# Patient Record
Sex: Female | Born: 1945 | Race: White | Hispanic: No | Marital: Married | Smoking: Former smoker
Health system: Southern US, Community
[De-identification: ages and names within clinical notes are randomized; demographics above are authoritative.]

## PROBLEM LIST (undated history)

## (undated) DIAGNOSIS — I10 Essential (primary) hypertension: Secondary | ICD-10-CM

## (undated) DIAGNOSIS — E7800 Pure hypercholesterolemia, unspecified: Secondary | ICD-10-CM

## (undated) DIAGNOSIS — M1990 Unspecified osteoarthritis, unspecified site: Secondary | ICD-10-CM

## (undated) DIAGNOSIS — G4730 Sleep apnea, unspecified: Secondary | ICD-10-CM

## (undated) DIAGNOSIS — E538 Deficiency of other specified B group vitamins: Secondary | ICD-10-CM

## (undated) DIAGNOSIS — Z8489 Family history of other specified conditions: Secondary | ICD-10-CM

## (undated) DIAGNOSIS — D649 Anemia, unspecified: Secondary | ICD-10-CM

## (undated) DIAGNOSIS — K589 Irritable bowel syndrome without diarrhea: Secondary | ICD-10-CM

## (undated) DIAGNOSIS — N1831 Chronic kidney disease, stage 3a: Secondary | ICD-10-CM

## (undated) DIAGNOSIS — K649 Unspecified hemorrhoids: Secondary | ICD-10-CM

## (undated) DIAGNOSIS — K5790 Diverticulosis of intestine, part unspecified, without perforation or abscess without bleeding: Secondary | ICD-10-CM

## (undated) DIAGNOSIS — E039 Hypothyroidism, unspecified: Secondary | ICD-10-CM

## (undated) DIAGNOSIS — Z87898 Personal history of other specified conditions: Secondary | ICD-10-CM

## (undated) DIAGNOSIS — M8580 Other specified disorders of bone density and structure, unspecified site: Secondary | ICD-10-CM

## (undated) DIAGNOSIS — I4510 Unspecified right bundle-branch block: Secondary | ICD-10-CM

## (undated) DIAGNOSIS — K219 Gastro-esophageal reflux disease without esophagitis: Secondary | ICD-10-CM

## (undated) DIAGNOSIS — E119 Type 2 diabetes mellitus without complications: Secondary | ICD-10-CM

## (undated) DIAGNOSIS — Z9289 Personal history of other medical treatment: Secondary | ICD-10-CM

## (undated) DIAGNOSIS — R42 Dizziness and giddiness: Secondary | ICD-10-CM

## (undated) DIAGNOSIS — G43909 Migraine, unspecified, not intractable, without status migrainosus: Secondary | ICD-10-CM

## (undated) DIAGNOSIS — T4145XA Adverse effect of unspecified anesthetic, initial encounter: Secondary | ICD-10-CM

## (undated) DIAGNOSIS — T8859XA Other complications of anesthesia, initial encounter: Secondary | ICD-10-CM

## (undated) DIAGNOSIS — Z9889 Other specified postprocedural states: Secondary | ICD-10-CM

## (undated) DIAGNOSIS — R112 Nausea with vomiting, unspecified: Secondary | ICD-10-CM

## (undated) DIAGNOSIS — E785 Hyperlipidemia, unspecified: Secondary | ICD-10-CM

## (undated) HISTORY — DX: Pure hypercholesterolemia, unspecified: E78.00

## (undated) HISTORY — DX: Migraine, unspecified, not intractable, without status migrainosus: G43.909

## (undated) HISTORY — PX: LAPAROSCOPY: SHX197

## (undated) HISTORY — PX: JOINT REPLACEMENT: SHX530

## (undated) HISTORY — DX: Personal history of other specified conditions: Z87.898

## (undated) HISTORY — DX: Gastro-esophageal reflux disease without esophagitis: K21.9

## (undated) HISTORY — DX: Personal history of other medical treatment: Z92.89

---

## 1975-04-07 DIAGNOSIS — J189 Pneumonia, unspecified organism: Secondary | ICD-10-CM

## 1975-04-07 HISTORY — PX: TUBAL LIGATION: SHX77

## 1975-04-07 HISTORY — DX: Pneumonia, unspecified organism: J18.9

## 2000-06-04 HISTORY — PX: COLONOSCOPY: SHX174

## 2002-04-06 HISTORY — PX: CHOLECYSTECTOMY: SHX55

## 2004-02-13 ENCOUNTER — Ambulatory Visit: Payer: Self-pay | Admitting: Family Medicine

## 2004-03-06 ENCOUNTER — Ambulatory Visit: Payer: Self-pay | Admitting: Otolaryngology

## 2004-08-27 ENCOUNTER — Ambulatory Visit: Payer: Self-pay | Admitting: Unknown Physician Specialty

## 2004-09-04 ENCOUNTER — Ambulatory Visit: Payer: Self-pay | Admitting: Unknown Physician Specialty

## 2005-03-26 ENCOUNTER — Emergency Department: Payer: Self-pay | Admitting: Emergency Medicine

## 2005-03-26 ENCOUNTER — Other Ambulatory Visit: Payer: Self-pay

## 2005-06-08 ENCOUNTER — Ambulatory Visit: Payer: Self-pay | Admitting: Unknown Physician Specialty

## 2005-07-05 HISTORY — PX: COLONOSCOPY WITH ESOPHAGOGASTRODUODENOSCOPY (EGD): SHX5779

## 2005-07-20 ENCOUNTER — Ambulatory Visit: Payer: Self-pay | Admitting: Gastroenterology

## 2005-07-21 ENCOUNTER — Ambulatory Visit: Payer: Self-pay | Admitting: Gastroenterology

## 2006-02-22 ENCOUNTER — Ambulatory Visit: Payer: Self-pay | Admitting: Urology

## 2006-06-30 ENCOUNTER — Ambulatory Visit: Payer: Self-pay

## 2007-07-20 ENCOUNTER — Ambulatory Visit: Payer: Self-pay

## 2008-06-27 ENCOUNTER — Ambulatory Visit: Payer: Self-pay | Admitting: Unknown Physician Specialty

## 2008-07-23 ENCOUNTER — Ambulatory Visit: Payer: Self-pay

## 2009-07-31 ENCOUNTER — Ambulatory Visit: Payer: Self-pay

## 2010-04-06 DIAGNOSIS — Z9289 Personal history of other medical treatment: Secondary | ICD-10-CM

## 2010-04-06 HISTORY — DX: Personal history of other medical treatment: Z92.89

## 2010-07-22 ENCOUNTER — Ambulatory Visit: Payer: Self-pay | Admitting: Unknown Physician Specialty

## 2010-08-04 ENCOUNTER — Ambulatory Visit: Payer: Self-pay

## 2011-07-23 IMAGING — MG MAM DGTL SCREENING MAMMO W/CAD
1 series · 5 of 5 positions shown · non-contrast
Comparison: none

REASON FOR EXAM: screening
COMMENTS:  Submitted by practice: [facility] Scheduled by user: Tadanori
Moolman

[R CC · right · 5 of 5 slices shown]
[im 1/5]
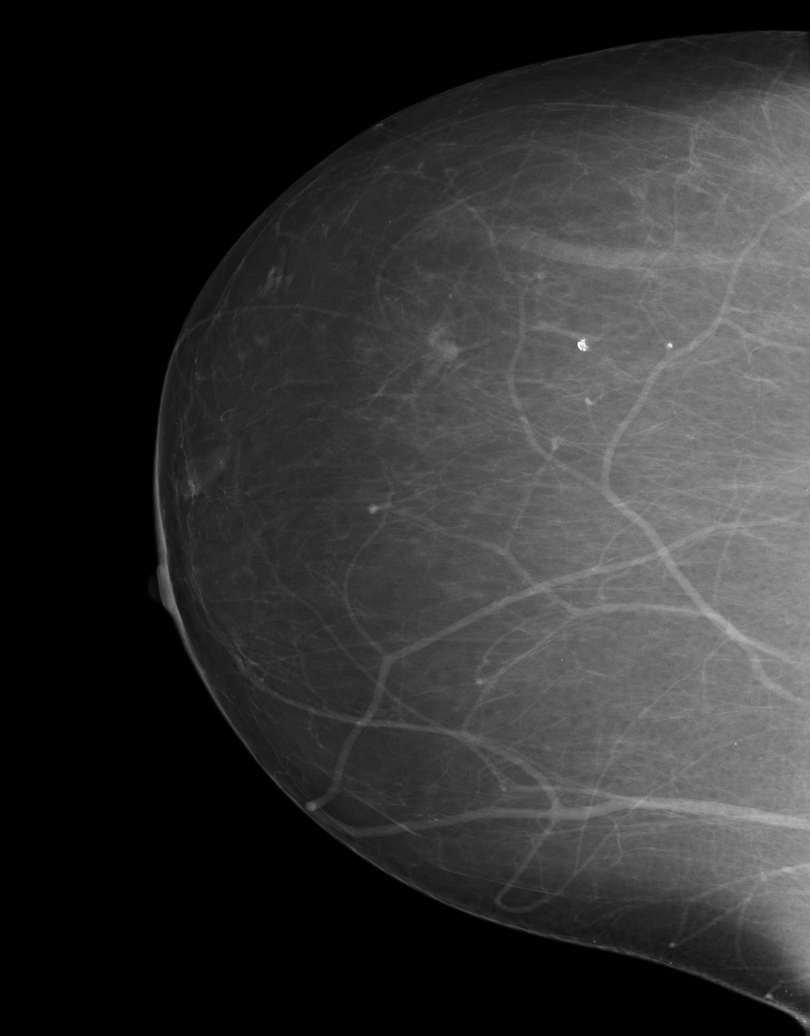
[im 2/5]
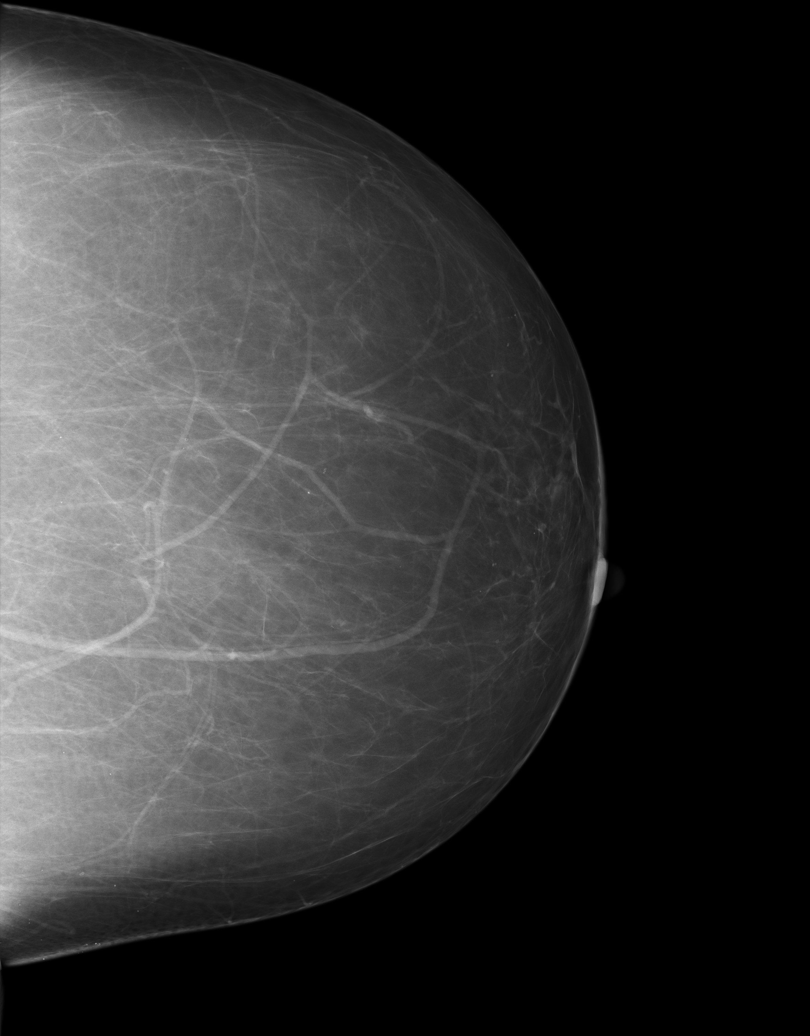
[im 3/5]
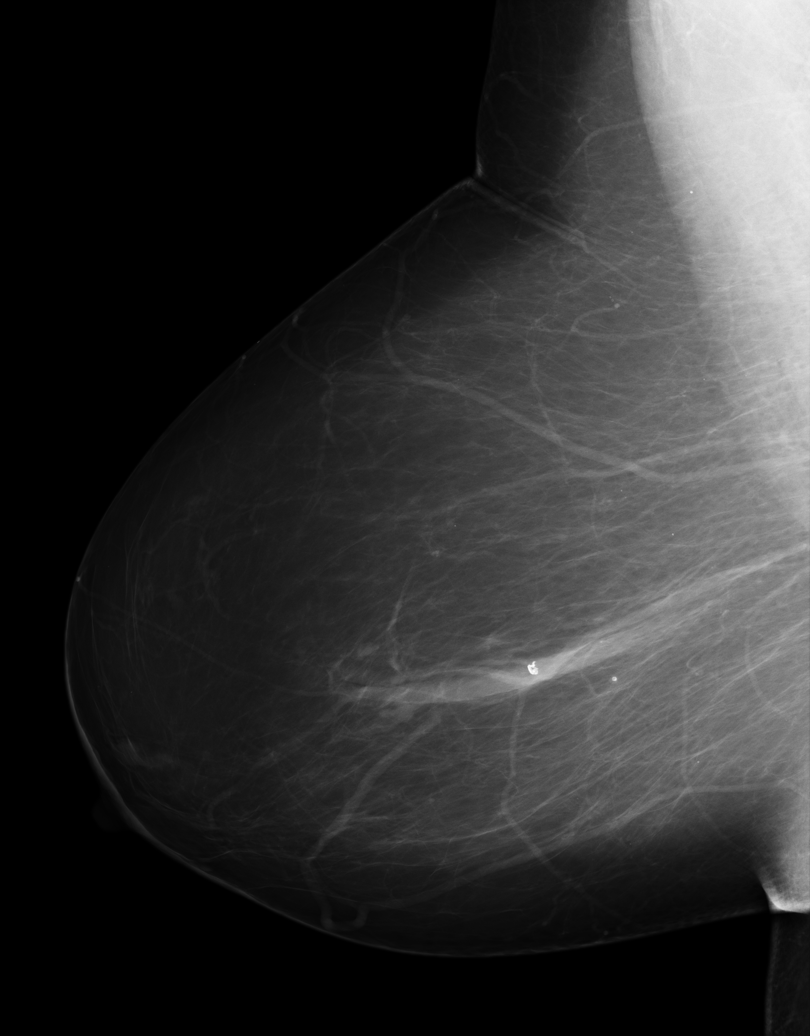
[im 4/5]
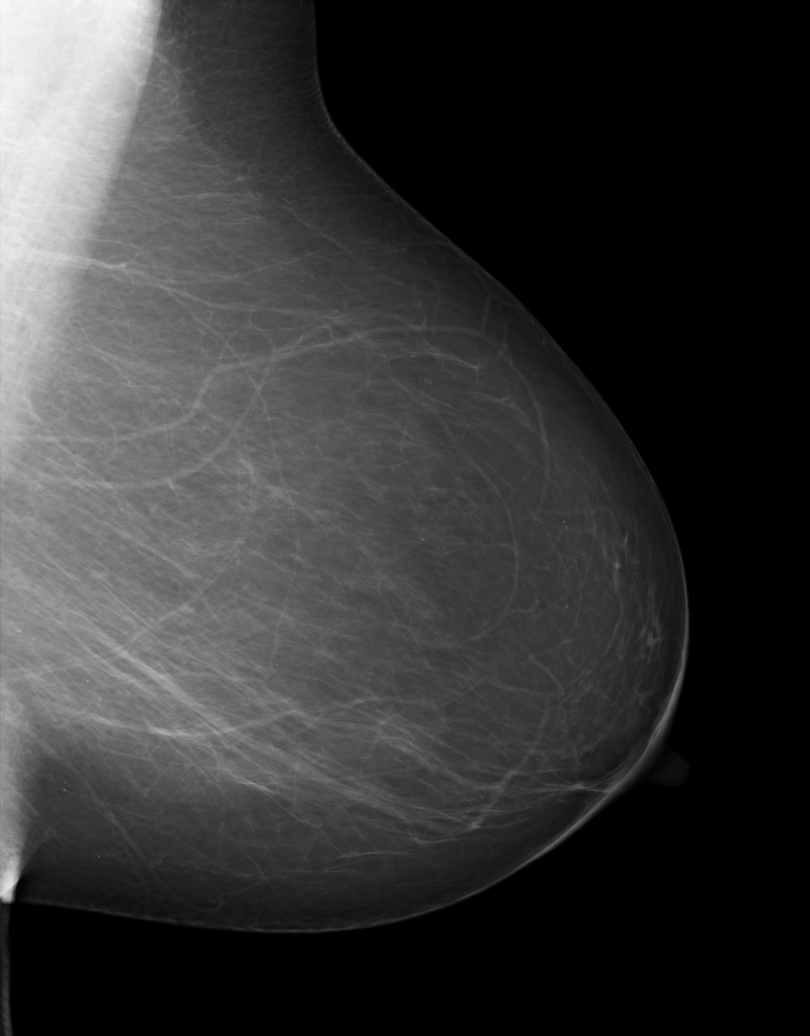
[im 5/5]
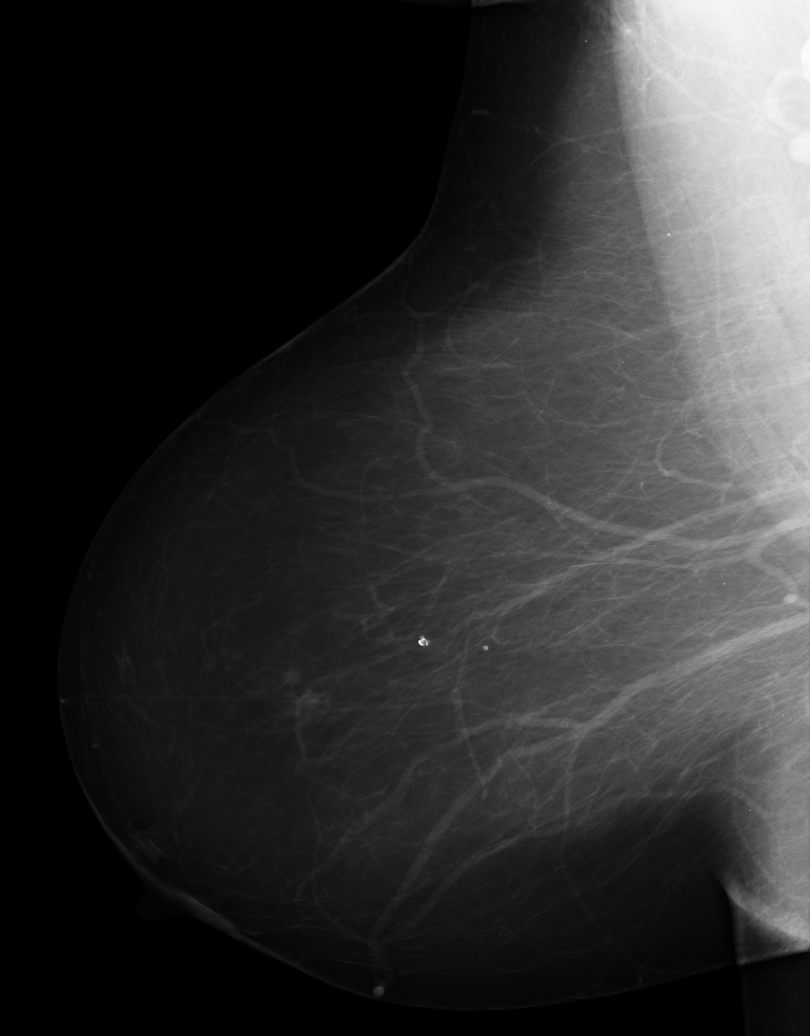

[5 of 5 positions shown; findings below may reference images not displayed]

PROCEDURE:     MAM - MAM DGTL SCREENING MAMMO W/CAD  - August 04, 2010  [DATE]

RESULT:       Comparison is made to prior studies dated 07/31/2009 and
07/23/2008.

The breasts are primarily fatty involuted.  There is no mammographic
evidence to suggest malignancy. Stable benign-appearing calcifications are
identified within the right breast.
IMPRESSION: BI-RADS:  Category 2- Benign Finding.

A negative mammogram report does not preclude biopsy or other evaluation of
a clinically palpable or otherwise suspicious mass or lesion. Breast cancer
may not be detected by mammography in up to 10% of cases.

## 2011-09-17 ENCOUNTER — Ambulatory Visit: Payer: Self-pay

## 2011-11-24 ENCOUNTER — Emergency Department: Payer: Self-pay | Admitting: *Deleted

## 2011-11-24 LAB — CBC
HCT: 37.5 % (ref 35.0–47.0)
HGB: 12.6 g/dL (ref 12.0–16.0)
MCV: 97 fL (ref 80–100)
RBC: 3.86 10*6/uL (ref 3.80–5.20)

## 2011-11-24 LAB — TROPONIN I: Troponin-I: <0.02 ng/mL

## 2011-11-24 LAB — BASIC METABOLIC PANEL
Anion Gap: 9 (ref 7–16)
BUN: 16 mg/dL (ref 7–18)
Chloride: 93 mmol/L — ABNORMAL LOW (ref 98–107)
Co2: 28 mmol/L (ref 21–32)
Creatinine: 1.17 mg/dL (ref 0.60–1.30)
Potassium: 4.2 mmol/L (ref 3.5–5.1)
Sodium: 130 mmol/L — ABNORMAL LOW (ref 136–145)

## 2011-11-24 LAB — CK TOTAL AND CKMB (NOT AT ARMC)
CK, Total: 114 U/L (ref 21–215)
CK, Total: 126 U/L (ref 21–215)
CK-MB: 0.9 ng/mL (ref 0.5–3.6)

## 2012-09-28 ENCOUNTER — Ambulatory Visit: Payer: Self-pay

## 2012-09-28 DIAGNOSIS — Z87898 Personal history of other specified conditions: Secondary | ICD-10-CM

## 2012-09-28 HISTORY — DX: Personal history of other specified conditions: Z87.898

## 2012-09-28 LAB — HM MAMMOGRAPHY

## 2013-09-01 DIAGNOSIS — K529 Noninfective gastroenteritis and colitis, unspecified: Secondary | ICD-10-CM | POA: Insufficient documentation

## 2013-09-04 DIAGNOSIS — G4730 Sleep apnea, unspecified: Secondary | ICD-10-CM | POA: Insufficient documentation

## 2013-09-04 HISTORY — PX: COLONOSCOPY WITH ESOPHAGOGASTRODUODENOSCOPY (EGD): SHX5779

## 2013-09-04 HISTORY — PX: COLONOSCOPY: SHX174

## 2013-09-19 ENCOUNTER — Ambulatory Visit: Payer: Self-pay | Admitting: Gastroenterology

## 2013-09-21 LAB — PATHOLOGY REPORT

## 2013-09-25 DIAGNOSIS — I10 Essential (primary) hypertension: Secondary | ICD-10-CM | POA: Insufficient documentation

## 2013-09-25 DIAGNOSIS — M1990 Unspecified osteoarthritis, unspecified site: Secondary | ICD-10-CM | POA: Insufficient documentation

## 2013-10-02 LAB — HM PAP SMEAR

## 2013-11-13 ENCOUNTER — Ambulatory Visit: Payer: Self-pay | Admitting: Family Medicine

## 2013-11-16 DIAGNOSIS — K589 Irritable bowel syndrome without diarrhea: Secondary | ICD-10-CM | POA: Insufficient documentation

## 2014-04-20 DIAGNOSIS — E119 Type 2 diabetes mellitus without complications: Secondary | ICD-10-CM | POA: Insufficient documentation

## 2014-04-20 DIAGNOSIS — E039 Hypothyroidism, unspecified: Secondary | ICD-10-CM | POA: Insufficient documentation

## 2014-04-25 ENCOUNTER — Ambulatory Visit: Payer: Self-pay | Admitting: General Practice

## 2014-04-25 DIAGNOSIS — I10 Essential (primary) hypertension: Secondary | ICD-10-CM

## 2014-04-25 LAB — SEDIMENTATION RATE: ERYTHROCYTE SED RATE: 18 mm/h (ref 0–30)

## 2014-04-25 LAB — CBC
HCT: 41.9 % (ref 35.0–47.0)
HGB: 13.7 g/dL (ref 12.0–16.0)
MCH: 33.3 pg (ref 26.0–34.0)
MCHC: 32.8 g/dL (ref 32.0–36.0)
MCV: 102 fL — ABNORMAL HIGH (ref 80–100)
Platelet: 301 10*3/uL (ref 150–440)
RBC: 4.13 10*6/uL (ref 3.80–5.20)
RDW: 13.4 % (ref 11.5–14.5)
WBC: 7.5 10*3/uL (ref 3.6–11.0)

## 2014-04-25 LAB — MRSA PCR SCREENING

## 2014-04-25 LAB — URINALYSIS, COMPLETE
Bilirubin,UR: NEGATIVE
Blood: NEGATIVE
GLUCOSE, UR: NEGATIVE mg/dL (ref 0–75)
Hyaline Cast: 2
Ketone: NEGATIVE
NITRITE: NEGATIVE
Ph: 6 (ref 4.5–8.0)
Protein: 30
RBC,UR: 1 /HPF (ref 0–5)
Specific Gravity: 1.019 (ref 1.003–1.030)
Squamous Epithelial: 3

## 2014-04-25 LAB — BASIC METABOLIC PANEL
Anion Gap: 3 — ABNORMAL LOW (ref 7–16)
BUN: 15 mg/dL (ref 7–18)
CALCIUM: 10.1 mg/dL (ref 8.5–10.1)
CREATININE: 0.94 mg/dL (ref 0.60–1.30)
Chloride: 102 mmol/L (ref 98–107)
Co2: 34 mmol/L — ABNORMAL HIGH (ref 21–32)
EGFR (African American): >60
Glucose: 117 mg/dL — ABNORMAL HIGH (ref 65–99)
Osmolality: 279 (ref 275–301)
Potassium: 3.7 mmol/L (ref 3.5–5.1)
Sodium: 139 mmol/L (ref 136–145)

## 2014-04-25 LAB — PROTIME-INR
INR: 0.9
Prothrombin Time: 12 secs (ref 11.5–14.7)

## 2014-04-25 LAB — APTT: Activated PTT: 26 secs (ref 23.6–35.9)

## 2014-04-25 LAB — HEMOGLOBIN A1C: Hemoglobin A1C: 6.7 % — ABNORMAL HIGH (ref 4.2–6.3)

## 2014-04-27 LAB — URINE CULTURE

## 2014-05-02 DIAGNOSIS — I4510 Unspecified right bundle-branch block: Secondary | ICD-10-CM | POA: Insufficient documentation

## 2014-05-14 ENCOUNTER — Inpatient Hospital Stay: Payer: Self-pay | Admitting: General Practice

## 2014-05-14 HISTORY — PX: TOTAL KNEE ARTHROPLASTY: SHX125

## 2014-05-15 ENCOUNTER — Encounter: Payer: Self-pay | Admitting: Internal Medicine

## 2014-06-05 ENCOUNTER — Encounter
Admit: 2014-06-05 | Disposition: A | Discharge status: Home / Self Care | Payer: Self-pay | Attending: Internal Medicine | Admitting: Internal Medicine

## 2014-07-06 ENCOUNTER — Encounter
Admit: 2014-07-06 | Disposition: A | Discharge status: Home / Self Care | Payer: Self-pay | Attending: Internal Medicine | Admitting: Internal Medicine

## 2014-08-05 NOTE — Discharge Summary (Signed)
PATIENT NAME:  Cindy Wolfe, Cindy Wolfe MR#:  [id number] DATE OF BIRTH:  03-19-1946  DATE OF ADMISSION:  05/14/2014 DATE OF DISCHARGE:  05/17/2014  ADMITTING DIAGNOSIS: Degenerative arthrosis of the right knee.   DISCHARGE DIAGNOSIS: Degenerative arthrosis of the right knee.   OPERATION: On 05/14/2014 the patient had a right total knee arthroplasty using computer-aided navigation.   SURGEON: Francesco SorJames Hooten, M.D.   ASSISTANT: Van ClinesJon Wolfe, PA.   ANESTHESIA: Spinal.   ESTIMATED BLOOD LOSS: 50 mL.  IMPLANTS USED: DePuy PFC Sigma size 4N narrow posterior stabilized femoral component that was cemented, size 4 MBT tibial component that was cemented, 35 mm three-peg oval dome patella that was cemented and a 10 mm stabilized rotating platform polyethylene insert. Gentamicin bone cement was used. The patient was stabilized, brought to the recovery room, and then brought down to the orthopedic floor.   HISTORY: The patient is a 69 year old female who presented for upcoming total knee replacement on the right. The patient has been refractory to conservative treatment. The patient has continued having pain with activities of daily living. The patient has tried cortisone injections and Synvisc injections.   PHYSICAL EXAMINATION: GENERAL: The patient has an antalgic gait with varus thrust of both knees with ambulation.  LUNGS: Clear to auscultation.  CARDIOVASCULAR: Regular rate and rhythm with no murmur.  MUSCULOSKELETAL: In regard to the right knee, the patient has moderate swelling. The patient has range of motion with full extension to 104 degrees of flexion. The patient has medial joint line pain.  NEUROLOGIC: Intact.  HOSPITAL COURSE: After initial admission on 05/14/2014, the patient had a hemoglobin of 12.9 and on postop day two it was 11.9. The patient did have a potassium level that was down at 3.2, which adding potassium increased it up to 3.5 on the day of discharge.   CONDITION AT DISCHARGE:  Stable.   DISPOSITION: The patient was sent to rehab. The patient did work with physical therapy and did well.   DISCHARGE INSTRUCTIONS: The patient [facility] May 29, 2014. The patient will do weight bear as tolerated. The patient will elevated the effected leg with 1 to 2 pillows. The patient will use thigh-high TED hose on both legs, to be removed 1 hour every 8 hour shift. The patient will do elevation of heels off the bed and incentive spirometer. The patient has a diabetic diet. The patient is to use the Polar Care to decrease swelling. The patient will keep her dressing on and do a dressing change as needed. The patient will call the clinic if there is any bright red bleeding or calf pain or bowel or bladder difficulty or fever greater than 101.5. The patient will do physical therapy per protocol and occupational therapy per protocol.   DISCHARGE MEDICATIONS: Metformin 500 mg 1 tablet b.i.d., Metamucil 1 dose daily, Imodium 30 mL p.r.n., Tylenol 500 mg 2 tablets q. 6 hours as needed for fever greater than 101.5 or any pain. acidophilus 1 capsule daily, Tums extra-strength 1 tablet daily, simply saline 1 dose nasally as needed, Gly-Oxide 10% topical liquid 4 times a day as needed, Bonine 25 mg 1 tablet t.i.d., vitamin D3 1000 international units 1 tablet daily, vitamin B12 500 mcg 1 tablet daily, multivitamin 1 tablet daily, Synthroid 125 mcg 1 tablet daily, hydrochlorothiazide/losartan 25 mg/100 mg 1 tablet at bedtime, amlodipine 5 mg 1 tablet daily, propranolol 40 mg 1 tablet b.i.d., oxycodone 5 mg 1 tablet every 4 hours as needed  for moderate pain, tramadol 50 mg 1 tablet q. 4 hours as needed for moderate pain, milk of magnesia 8% 30 mL b.i.d., Lovenox 40 mg subcutaneous every 12 hours x14 days and discontinue, betamethasone/clotrimazole 0.05% topical q. 24 hours, bisacodyl 10 mg rectally p.r.n. for constipation, Senokot-S 1 tablet b.i.d., pantoprazole 40  mg 1 tablet b.i.d.   The patient will not begin an aspirin 81 mg until her Lovenox has been completed.   ____________________________ Shela Commons. Dedra Skeens, [place] jtm:sb D: 05/17/2014 06:59:32 ET T: 05/17/2014 07:08:09 ET JOB#: [id number]  cc: J. Dedra Skeens, [place], <Dictator> J Jaquitta Dupriest [facility] PA ELECTRONICALLY SIGNED 05/24/2014 15:50

## 2014-08-05 NOTE — Op Note (Signed)
PATIENT NAME:  Cindy Wolfe, Cindy Wolfe MR#:  [id number] DATE OF BIRTH:  Sep 17, 1945  DATE OF PROCEDURE:  05/14/2014  PREOPERATIVE DIAGNOSIS: Degenerative arthrosis of the right knee (primary).  POSTOPERATIVE DIAGNOSIS: Degenerative arthrosis of the right knee (primary).   PROCEDURE PERFORMED: Right total knee arthroplasty using computer-assisted navigation.   SURGEON: Illene LabradorJames P. Angie FavaHooten Jr., MD   ASSISTANT: Van ClinesJon Wolfe, PA (required to maintain retraction throughout the procedure).   ANESTHESIA: Spinal.   ESTIMATED BLOOD LOSS: 50 mL.   FLUIDS REPLACED: 1400 mL of crystalloid.  TOURNIQUET TIME: 87 minutes.   DRAINS: Two medium drains to reinfusion system.  SOFT TISSUE RELEASES: Anterior cruciate ligament, posterior cruciate ligament, deep and superficial medial collateral ligament, and patellofemoral ligament.   IMPLANTS UTILIZED: DePuy PFC Sigma size 4N (narrow) posterior stabilized femoral component (cemented), size 4 MBT tibial component (cemented), 35 mm 3-peg oval dome patella (cemented), and a 10 mm stabilized rotating platform polyethylene insert. Gentamicin bone cement was utilized during the procedure due to the patient's history of diabetes.  INDICATIONS FOR SURGERY: The patient is a 69 year old female who has been seen for complaints of progressive bilateral knee pain with the right knee more symptomatic than the left. X-rays demonstrated severe degenerative changes in tricompartmental fascia with relative varus deformity. After discussion of the risks and benefits of surgical intervention, the patient expressed understanding of the risks and benefits and agreed with plans for surgical intervention.   PROCEDURE IN DETAIL: The patient was brought into the operating room, and, after adequate spinal anesthesia was achieved, a tourniquet was placed on the patient's upper right thigh. The patient's right knee and leg were cleaned and prepped with alcohol and DuraPrep and draped in the usual  sterile fashion. A "timeout" was performed as per usual protocol. The right lower extremity was exsanguinated using an Esmarch, and the tourniquet was inflated to 300 mmHg. An anterior longitudinal incision was made followed by a standard mid vastus approach. A large effusion was evacuated. The deep fibers of the medial collateral ligament were elevated in a subperiosteal fashion off the medial flare of the tibia so as to maintain a continuous soft tissue sleeve. The patella was subluxed laterally and the patellofemoral ligament was incised. Inspection of the knee demonstrated severe degenerative changes with full-thickness loss of articular cartilage. Prominent osteophytes were debrided using a rongeur. Two 4.0 mm Schanz pins were inserted into the femur and into the tibia for attachment of the array of trackers used for computer-assisted navigation. Hip center was identified using circumduction technique. Distal landmarks were mapped using the computer. The distal femur and proximal tibia were mapped using the computer. Distal femoral cutting guide was positioned using computer-assisted navigation so as to achieve a 5-degree distal valgus cut. Cut was performed and verified using the computer. Distal femur was sized and it was felt that a size 4N (narrow) femoral component was appropriate. A size 4 cutting guide was positioned and the anterior cut was performed and verified using the computer. This was followed by completion of the posterior and chamfer cuts. Femoral cutting guide for the central box was then positioned and the central box cut was performed. Attention was then directed to the proximal tibia. Medial and lateral menisci were excised. The extramedullary tibial cutting guide was positioned using computer-assisted navigation so as to achieve a 0-degree varus-valgus alignment and a 0-degree posterior slope. Cut was performed and verified using the computer. The proximal tibia was sized and it was felt  that a size 4 tibial  tray was appropriate. Tibial and femoral trials were inserted. Posterior osteophytes were debrided off of the femoral condyles. A 10 mm stabilized rotating platform polyethylene insert was inserted. The knee was felt to be tight medially. A Cobb elevator was used to elevate the superficial fibers of the medial collateral ligament in a subperiosteal fashion. This allowed for excellent mediolateral soft tissue balancing both in full extension and in flexion. Finally, the patella was cut and prepared so as to accommodate a 35 mm 3-peg oval dome patella. Patellar trial was placed and the knee was placed through a range of motion with excellent patellar tracking appreciated. The femoral trial was removed. The central post hole for the tibial component was reamed followed by insertion of a keel punch. Tibial trial was then removed. The cut surfaces of bone were irrigated with copious amounts of normal saline with antibiotic solution using pulsatile lavage and then suctioned dry. Polymethyl methacrylate cement with gentamicin was prepared in the usual fashion using a vacuum mixer. Cement was applied to the cut surface of the proximal tibia as well as along the undersurface of a size 4 MBT tibial component. Tibial component was positioned and impacted into place. Excess cement was removed using freer elevators. Cement was then applied to the cut surface of the femur as well as on the posterior flanges of a size 4N (narrow) posterior stabilized femoral component. Femoral component was positioned and impacted into place. Excess cement was removed using freer elevators. A 10 mm polyethylene trial was inserted and the knee was brought into full extension with steady axial compression applied. Finally, cement was applied to the backside of a 35 mm 3-peg oval dome patella and the patellar component was positioned and patellar clamp applied. Excess cement was removed using freer elevators. After adequate  curing of cement, the tourniquet was deflated after a total tourniquet time of 87 minutes. Hemostasis was achieved using electrocautery. The knee was irrigated with copious amounts of normal saline with antibiotic solution using pulsatile lavage and then suctioned dry. The knee was inspected for any residual cement debris. Then, 20 mL of 1.3% Exparel in 40 mL of normal saline was then injected along the posterior capsule, medial and lateral gutters, and along the arthrotomy site. A 10 mm stabilized rotating platform polyethylene insert was inserted and the knee was placed through a range of motion with excellent patellar tracking appreciated and excellent medial and lateral soft tissue balancing noted. Two medium drains were placed in the wound bed and brought out through a separate stab incision to be attached to a reinfusion system. The medial parapatellar portion of the incision was reapproximated using interrupted sutures of #1 Vicryl. The subcutaneous tissue was reapproximated in layers using first #0 Vicryl followed by #2-0 Vicryl. Then, 30 mL of 0.25% Marcaine with epinephrine was injected along the incision site in the subcutaneous tissue. Skin was closed with skin staples. Sterile dressing was applied.  The patient tolerated the procedure well. She was transported to the recovery room in stable condition.   ____________________________ Illene Labrador. Angie Fava., MD jph:ST D: 05/14/2014 20:09:27 ET T: 05/15/2014 01:54:08 ET JOB#: [id number]  cc: Illene Labrador. Angie Fava., MD, <Dictator> JAMES P Angie Fava MD ELECTRONICALLY SIGNED 05/21/2014 20:19

## 2014-11-23 NOTE — Patient Outreach (Signed)
[facility]) Care Management  11/23/2014  Arella Blinder [facility] 01/08/1946 [id number]   Referral from HTA tier 4 list, assigned to Colleen Can, [facility] for patient outreach.  Lidia Clavijo L. Anniebell Bedore, AAS [facility] Care Management Assistant

## 2014-12-17 ENCOUNTER — Other Ambulatory Visit: Payer: Self-pay

## 2014-12-17 NOTE — Patient Outreach (Signed)
[facility]) Care Management  12/17/2014  Lidia Clavijo [facility] 1945-05-22 [id number]   SUBJECTIVE: Received return call from patient. RNCM discussed and offered [facility] care management services to patient.  Patient refused services at this time.  Patient verbally agreed to receive [facility] outreach letter and pamphlet for future reference.   PLAN: RNCM will refer patient to Damita Rhodie to close due to refusal of services.  RNCM will send patient outreach letter and pamphlet as discussed.  RNCM will notify patients primary MD of refusal of services.   George Ina RN,BSN,CCM [facility] Telephonic Care Coordinator [phone]

## 2014-12-17 NOTE — Patient Outreach (Signed)
[facility]) Care Management  12/17/2014  Cindy Wolfe [facility] 20-Sep-1945 [id number]   Notification received from George Ina, RNCM to close patient case due to refusal of services.  Cindy Wolfe L. Katelynne Revak, AAS [facility] Care Management Assistant

## 2014-12-17 NOTE — Patient Outreach (Signed)
[facility]) Care Management  12/17/2014  Adan Beal [facility] Sep 06, 1945 [id number]   Referral reassigned to George Ina, [facility] for patient outreach.  Xhaiden Coombs L. Redmond Whittley, AAS [facility] Care Management Assistant

## 2014-12-17 NOTE — Patient Outreach (Signed)
[facility]) Care Management  12/17/2014  Cindy Wolfe [facility] 12-14-1945 [id number]  SUBJECTIVE: Telephone call to patient regarding health team advantage referral.  Unable to reach.  HIPAA compliant voice message left with call back phone number.   PLAN: RNCM will attempt 2nd telephone outreach to patient within 3 business days.   George Ina RN,BSN,CCM [facility] Telephonic Care Coordinator [phone]

## 2015-05-02 IMAGING — CR DG KNEE 1-2V*R*
1 series · 2 of 2 positions shown · non-contrast
Comparison: None.

CLINICAL DATA: Status post right total knee joint replacement.

EXAM:
RIGHT KNEE - 1-2 VIEW

[Series 1: ap · 0.17mm/px · 2 of 2 slices shown]
[im 1/2]
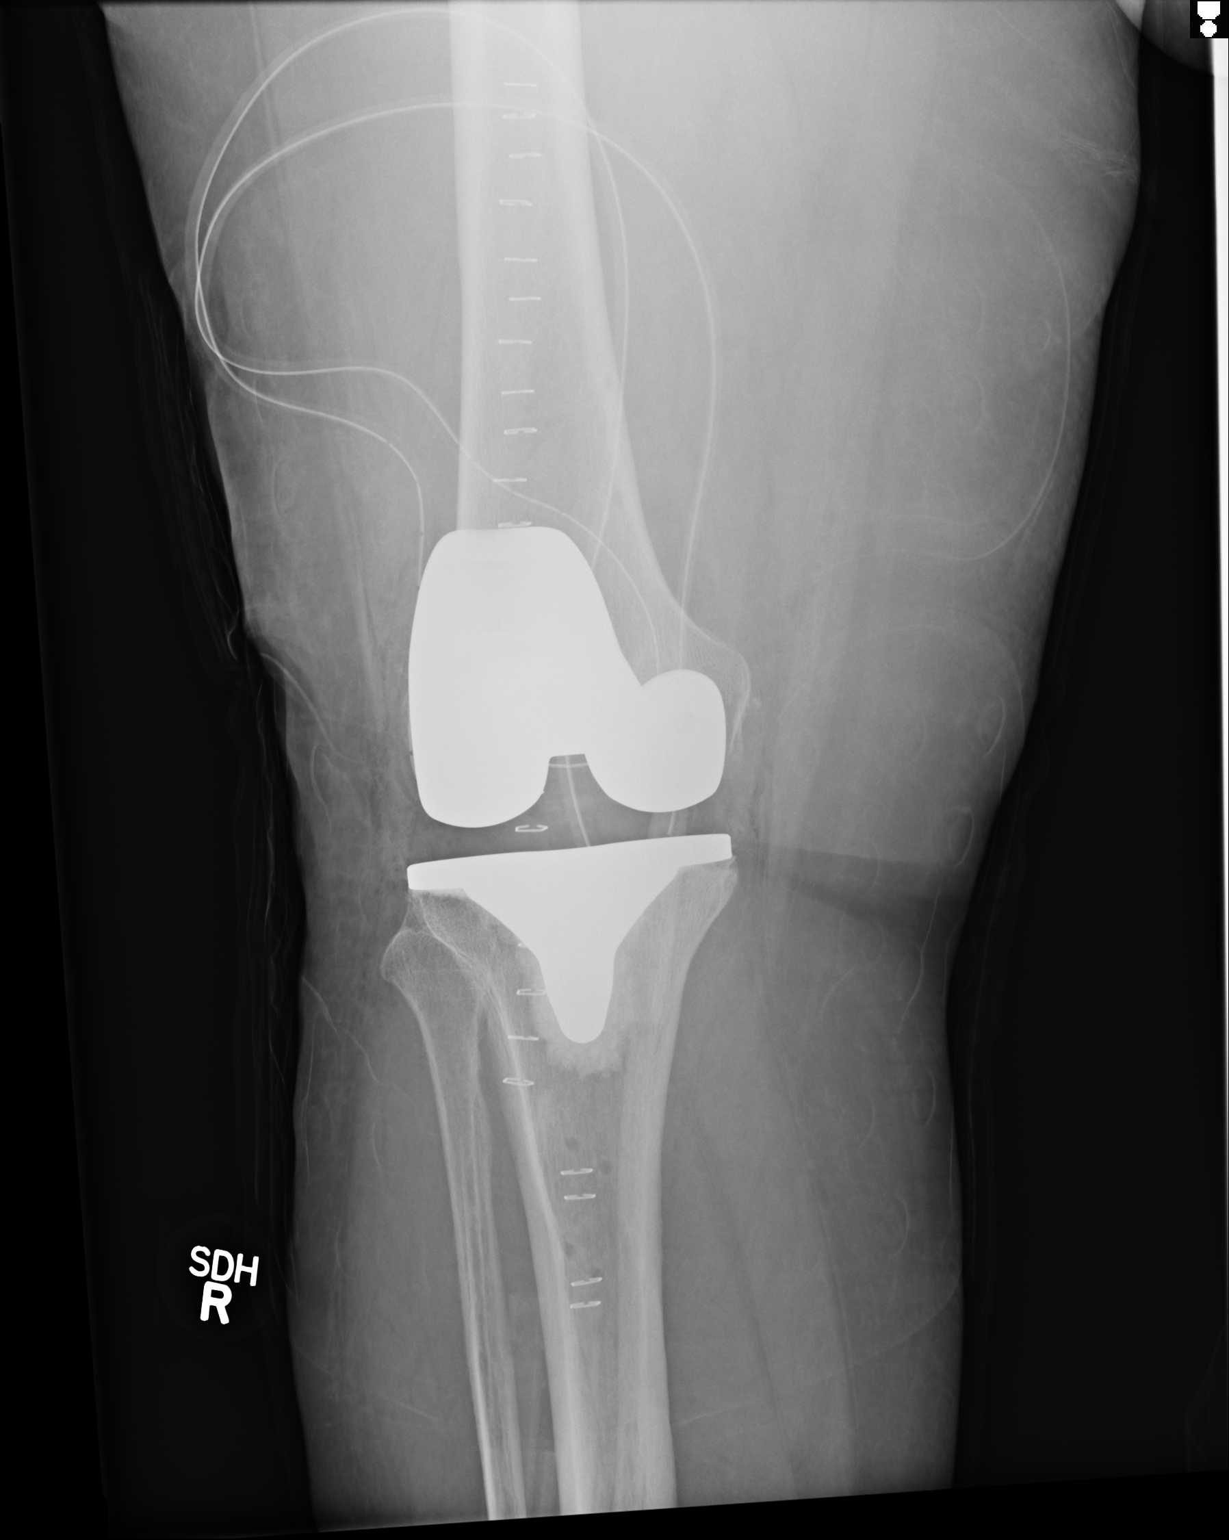
[im 2/2]
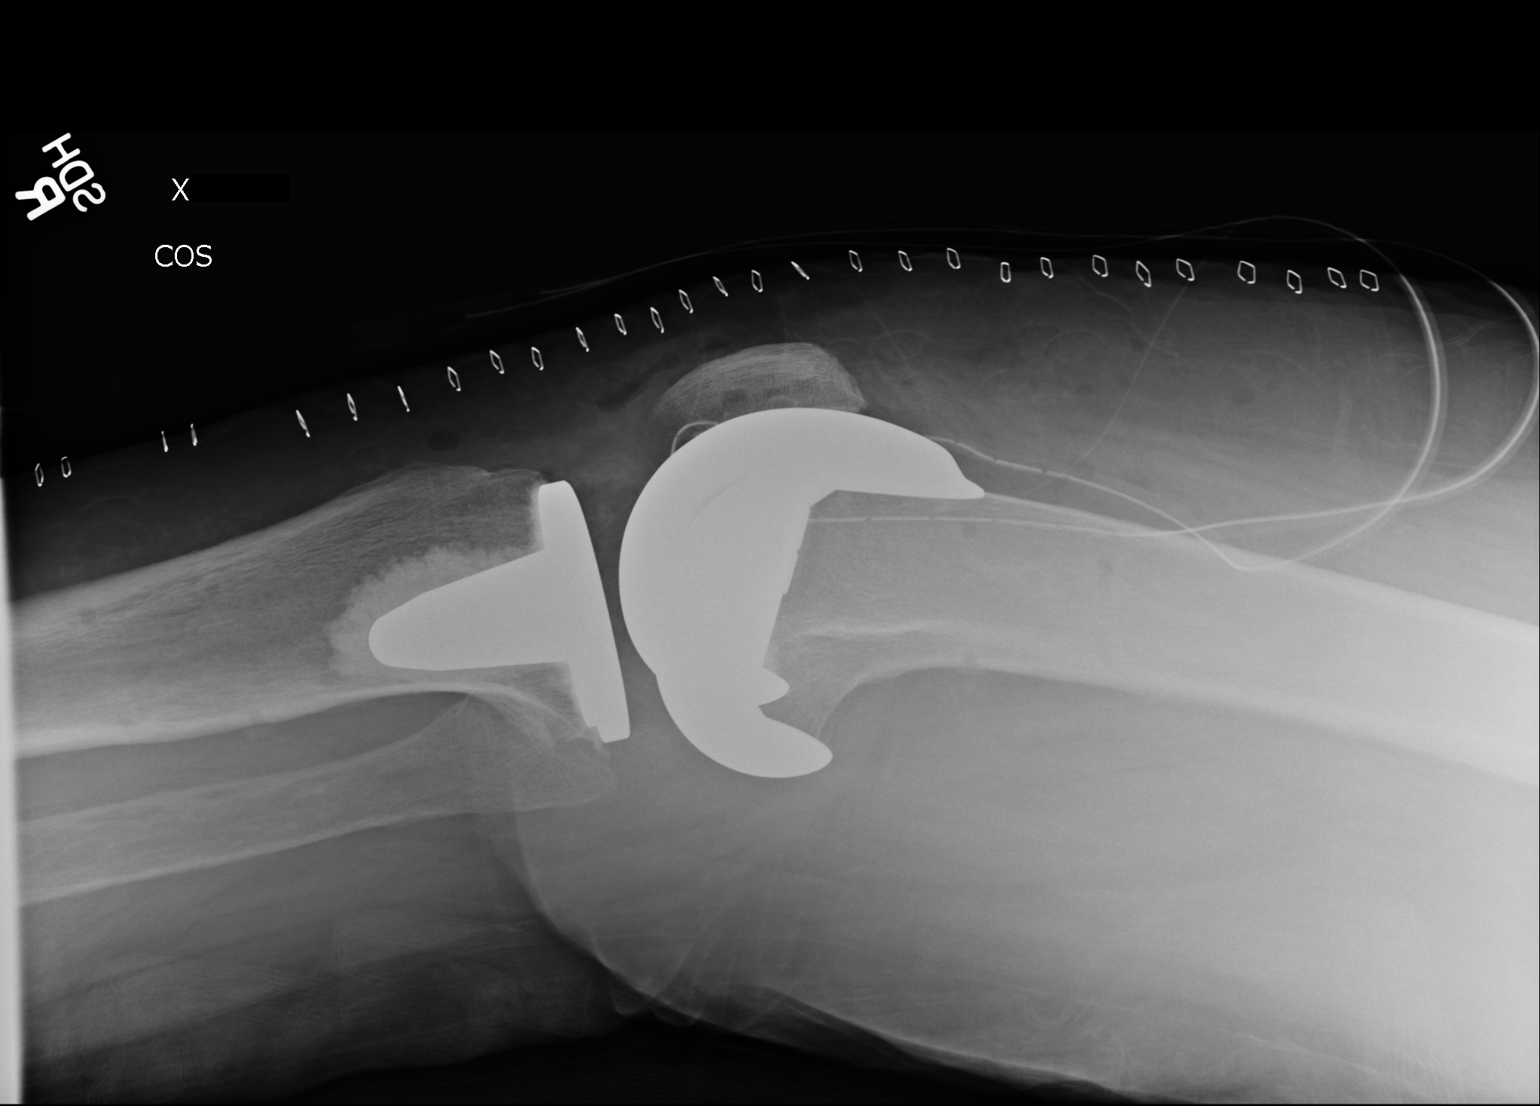

[2 of 2 positions shown; findings below may reference images not displayed]

FINDINGS: AP and lateral views of the right knee reveal the presence of a
total joint prosthesis. The positioning of the prosthetic components
is good. The interface of the prosthesis with the cement and native
bone is good. Surgical drain lines and skin staples are present.
IMPRESSION: The patient has undergone right total knee joint prosthesis
placement without immediate postprocedure complication.

## 2015-05-20 DIAGNOSIS — I10 Essential (primary) hypertension: Secondary | ICD-10-CM | POA: Diagnosis not present

## 2015-05-20 DIAGNOSIS — E119 Type 2 diabetes mellitus without complications: Secondary | ICD-10-CM | POA: Diagnosis not present

## 2015-05-20 DIAGNOSIS — M1990 Unspecified osteoarthritis, unspecified site: Secondary | ICD-10-CM | POA: Diagnosis not present

## 2015-05-20 DIAGNOSIS — E785 Hyperlipidemia, unspecified: Secondary | ICD-10-CM | POA: Diagnosis not present

## 2015-05-20 DIAGNOSIS — E039 Hypothyroidism, unspecified: Secondary | ICD-10-CM | POA: Diagnosis not present

## 2015-05-21 DIAGNOSIS — Z96651 Presence of right artificial knee joint: Secondary | ICD-10-CM | POA: Diagnosis not present

## 2015-05-21 DIAGNOSIS — M25562 Pain in left knee: Secondary | ICD-10-CM | POA: Diagnosis not present

## 2015-05-21 DIAGNOSIS — M1712 Unilateral primary osteoarthritis, left knee: Secondary | ICD-10-CM | POA: Diagnosis not present

## 2015-05-21 DIAGNOSIS — G8929 Other chronic pain: Secondary | ICD-10-CM | POA: Diagnosis not present

## 2015-07-12 ENCOUNTER — Other Ambulatory Visit: Payer: Self-pay | Admitting: *Deleted

## 2015-07-12 ENCOUNTER — Encounter: Payer: Self-pay | Admitting: *Deleted

## 2015-07-12 NOTE — Patient Outreach (Addendum)
[facility] Care Management  07/12/2015  Galvin ProfferKathryn Ellen [facility] Wolfe 10-15-1945 [id number]  Subjective: Telephone call to patient's home number, spoke with patient, and HIPAA verified. Patient states she is going great.   [facility] to speak with husband Cindy Wolfe(Richard) regarding her heatlhcare needs as needed.   Discussed [facility] Care [facility] services and patient in agreement to complete telephone screen. Patient states her primary MD is Dr. Dorothey Basemanavid Bronstein.   Patient states she does not have any care coordination, disease education, disease monitoring, transportation, or community resource needs at this time.    Patient has declined [facility] services and is in agreement to receive [facility] information.   Objective: Per chart review:  Patient  [facility]ined [facility] services on 12/17/14.  Assessment: Received HTA Tier 3 list referral on 07/05/15.   0 Admissions and 2 ER visits.   No diagnosis listed.   Request to confirm patient's primary MD.   Plan: RNCM will send patient successful outreach letter, [facility] pamph[facility], and magnet. RNCM will send patient's primary MD case closure letter due to refusal / no care management needs.   RNCM will send case closure due to refusal/ no care management needs request to Sherle PoeNicole Robinson at [facility] Care Management. RNCM will send request to update primary MD in chart to Sherle [facility]Nicole Robinson [facility].   Dilyn Smiles H. Gardiner Barefootooper RN, BSN, CCM [facility] Care Management [facility] Telephonic CM Phone: [phone] Fax: [phone]

## 2015-08-19 DIAGNOSIS — E785 Hyperlipidemia, unspecified: Secondary | ICD-10-CM | POA: Diagnosis not present

## 2015-08-19 DIAGNOSIS — E119 Type 2 diabetes mellitus without complications: Secondary | ICD-10-CM | POA: Diagnosis not present

## 2015-08-19 DIAGNOSIS — E039 Hypothyroidism, unspecified: Secondary | ICD-10-CM | POA: Diagnosis not present

## 2015-08-19 DIAGNOSIS — I10 Essential (primary) hypertension: Secondary | ICD-10-CM | POA: Diagnosis not present

## 2015-08-20 DIAGNOSIS — E039 Hypothyroidism, unspecified: Secondary | ICD-10-CM | POA: Diagnosis not present

## 2015-08-20 DIAGNOSIS — E119 Type 2 diabetes mellitus without complications: Secondary | ICD-10-CM | POA: Diagnosis not present

## 2015-08-20 DIAGNOSIS — I10 Essential (primary) hypertension: Secondary | ICD-10-CM | POA: Diagnosis not present

## 2015-08-20 DIAGNOSIS — E785 Hyperlipidemia, unspecified: Secondary | ICD-10-CM | POA: Diagnosis not present

## 2015-09-10 DIAGNOSIS — M25562 Pain in left knee: Secondary | ICD-10-CM | POA: Diagnosis not present

## 2015-09-12 ENCOUNTER — Encounter
Admission: RE | Admit: 2015-09-12 | Discharge: 2015-09-12 | Disposition: A | Discharge status: Home / Self Care | Payer: PPO | Source: Ambulatory Visit | Attending: Orthopedic Surgery | Admitting: Orthopedic Surgery

## 2015-09-12 DIAGNOSIS — Z01812 Encounter for preprocedural laboratory examination: Secondary | ICD-10-CM | POA: Diagnosis not present

## 2015-09-12 DIAGNOSIS — Z01810 Encounter for preprocedural cardiovascular examination: Secondary | ICD-10-CM | POA: Diagnosis not present

## 2015-09-12 DIAGNOSIS — I10 Essential (primary) hypertension: Secondary | ICD-10-CM | POA: Diagnosis not present

## 2015-09-12 HISTORY — DX: Irritable bowel syndrome, unspecified: K58.9

## 2015-09-12 HISTORY — DX: Unspecified osteoarthritis, unspecified site: M19.90

## 2015-09-12 HISTORY — DX: Unspecified hemorrhoids: K64.9

## 2015-09-12 HISTORY — DX: Type 2 diabetes mellitus without complications: E11.9

## 2015-09-12 HISTORY — DX: Family history of other specified conditions: Z84.89

## 2015-09-12 HISTORY — DX: Other complications of anesthesia, initial encounter: T88.59XA

## 2015-09-12 HISTORY — DX: Nausea with vomiting, unspecified: R11.2

## 2015-09-12 HISTORY — DX: Sleep apnea, unspecified: G47.30

## 2015-09-12 HISTORY — DX: Dizziness and giddiness: R42

## 2015-09-12 HISTORY — DX: Essential (primary) hypertension: I10

## 2015-09-12 HISTORY — DX: Other specified postprocedural states: Z98.890

## 2015-09-12 HISTORY — DX: Adverse effect of unspecified anesthetic, initial encounter: T41.45XA

## 2015-09-12 HISTORY — DX: Hypothyroidism, unspecified: E03.9

## 2015-09-12 LAB — URINALYSIS COMPLETE WITH MICROSCOPIC (ARMC ONLY)
BILIRUBIN URINE: NEGATIVE
Glucose, UA: NEGATIVE mg/dL
Hgb urine dipstick: NEGATIVE
KETONES UR: NEGATIVE mg/dL
NITRITE: NEGATIVE
PH: 7 (ref 5.0–8.0)
PROTEIN: NEGATIVE mg/dL
SPECIFIC GRAVITY, URINE: 1.012 (ref 1.005–1.030)
Squamous Epithelial / LPF: NONE SEEN

## 2015-09-12 LAB — SURGICAL PCR SCREEN
MRSA, PCR: NEGATIVE
Staphylococcus aureus: NEGATIVE

## 2015-09-12 LAB — APTT: aPTT: 25 seconds (ref 24–36)

## 2015-09-12 LAB — SEDIMENTATION RATE: Sed Rate: 26 mm/hr (ref 0–30)

## 2015-09-12 LAB — TYPE AND SCREEN
ABO/RH(D): A POS
ANTIBODY SCREEN: NEGATIVE

## 2015-09-12 LAB — PROTIME-INR
INR: 0.88
Prothrombin Time: 12.2 seconds (ref 11.4–15.0)

## 2015-09-12 NOTE — Pre-Procedure Instructions (Signed)
MEDICAL CLEARANCE/EKGS CALLED AND FAXED TO CINDY AT [facility]

## 2015-09-12 NOTE — Patient Instructions (Signed)
  Your procedure is scheduled on:Wednesday June 21 , 2017. Report to Same Day Surgery. To find out your arrival time please call [phone] between 1PM - 3PM on Tuesday September 24, 2015.  Remember: Instructions that are not followed completely may result in serious medical risk, up to and including death, or upon the discretion of your surgeon and anesthesiologist your surgery may need to be rescheduled.    _x___ 1. Do not eat food or drink liquids after midnight. No gum chewing or hard candies.     _x___ 2. No Alcohol for 24 hours before or after surgery.   ____ 3. Bring all medications with you on the day of surgery if instructed.    __x__ 4. Notify your doctor if there is any change in your medical condition     (cold, fever, infections).     Do not wear jewelry, make-up, hairpins, clips or nail polish.  Do not wear lotions, powders, or perfumes. You may wear deodorant.  Do not shave 48 hours prior to surgery. Men may shave face and neck.  Do not bring valuables to the hospital.    [facility] any belongings or valuables.               Contacts, dentures or bridgework may not be worn into surgery.  Leave your suitcase in the car. After surgery it may be brought to your room.  For patients admitted to the hospital, discharge time is determined by your treatment team.   Patients discharged the day of surgery will not be allowed to drive home.    Please read over the following fact sheets that you were given:   [facility] Pre[facility]  _x___ Take these medicines the morning of surgery with A SIP OF WATER:    1. levothyroxine (SYNTHROID, LEVOTHROID)  2. propranolol (INDERAL)     ____ Fleet Enema (as directed)   _x___ Use CHG Soap as directed on instruction sheet  ____ Use inhalers on the day of surgery and bring to hospital day of surgery  _x___ Stop metformin 2 days prior to surgery on September 23, 2015.    ____ Take 1/2 of usual insulin dose  the night before surgery and none on the morning of  surgery.   ____ Stop aspirin does not apply.  ____ Stop Anti-inflammatories such as Advil, Aleve, Ibuprofen, Motrin, Naproxen,  Naprosyn, Goodies powders or aspirin products.Tylenol is OK to take.   _x___ Stop supplements: vitamin B-12, Multiple Vitamins-Iron (MULTIVITAMINS WITH IRON)  until after surgery.    _x___ Bring C-Pap to the hospital.

## 2015-09-14 LAB — URINE CULTURE: Special Requests: NORMAL

## 2015-09-16 NOTE — Pre-Procedure Instructions (Signed)
FAXED URINE CULTURE RESULTS TO DR HOOTEN 

## 2015-09-17 DIAGNOSIS — R079 Chest pain, unspecified: Secondary | ICD-10-CM | POA: Diagnosis not present

## 2015-09-18 NOTE — Pre-Procedure Instructions (Signed)
CLEARED BY DR Terance HartBRONSTEIN LOW RISK 09/18/15

## 2015-09-25 ENCOUNTER — Inpatient Hospital Stay: Payer: PPO | Admitting: Certified Registered Nurse Anesthetist

## 2015-09-25 ENCOUNTER — Inpatient Hospital Stay
Admission: RE | Admit: 2015-09-25 | Discharge: 2015-09-27 | DRG: 470 | Disposition: A | Discharge status: Home Health | Payer: PPO | Source: Ambulatory Visit | Attending: Orthopedic Surgery | Admitting: Orthopedic Surgery

## 2015-09-25 ENCOUNTER — Inpatient Hospital Stay: Payer: PPO

## 2015-09-25 ENCOUNTER — Encounter
Admission: RE | Disposition: A | Discharge status: Home Health | Payer: Self-pay | Source: Ambulatory Visit | Attending: Orthopedic Surgery

## 2015-09-25 ENCOUNTER — Encounter: Payer: Self-pay | Admitting: Orthopedic Surgery

## 2015-09-25 DIAGNOSIS — M1990 Unspecified osteoarthritis, unspecified site: Secondary | ICD-10-CM | POA: Diagnosis not present

## 2015-09-25 DIAGNOSIS — M179 Osteoarthritis of knee, unspecified: Secondary | ICD-10-CM | POA: Diagnosis not present

## 2015-09-25 DIAGNOSIS — Z882 Allergy status to sulfonamides status: Secondary | ICD-10-CM | POA: Diagnosis not present

## 2015-09-25 DIAGNOSIS — Z888 Allergy status to other drugs, medicaments and biological substances status: Secondary | ICD-10-CM | POA: Diagnosis not present

## 2015-09-25 DIAGNOSIS — K589 Irritable bowel syndrome without diarrhea: Secondary | ICD-10-CM | POA: Diagnosis present

## 2015-09-25 DIAGNOSIS — E1142 Type 2 diabetes mellitus with diabetic polyneuropathy: Secondary | ICD-10-CM | POA: Diagnosis present

## 2015-09-25 DIAGNOSIS — E039 Hypothyroidism, unspecified: Secondary | ICD-10-CM | POA: Diagnosis present

## 2015-09-25 DIAGNOSIS — E7800 Pure hypercholesterolemia, unspecified: Secondary | ICD-10-CM | POA: Diagnosis not present

## 2015-09-25 DIAGNOSIS — Z79899 Other long term (current) drug therapy: Secondary | ICD-10-CM | POA: Diagnosis not present

## 2015-09-25 DIAGNOSIS — Z96659 Presence of unspecified artificial knee joint: Secondary | ICD-10-CM

## 2015-09-25 DIAGNOSIS — Z7984 Long term (current) use of oral hypoglycemic drugs: Secondary | ICD-10-CM

## 2015-09-25 DIAGNOSIS — Z471 Aftercare following joint replacement surgery: Secondary | ICD-10-CM | POA: Diagnosis not present

## 2015-09-25 DIAGNOSIS — Z96652 Presence of left artificial knee joint: Secondary | ICD-10-CM | POA: Diagnosis not present

## 2015-09-25 DIAGNOSIS — E669 Obesity, unspecified: Secondary | ICD-10-CM | POA: Diagnosis not present

## 2015-09-25 DIAGNOSIS — I10 Essential (primary) hypertension: Secondary | ICD-10-CM | POA: Diagnosis not present

## 2015-09-25 DIAGNOSIS — E119 Type 2 diabetes mellitus without complications: Secondary | ICD-10-CM | POA: Diagnosis not present

## 2015-09-25 DIAGNOSIS — G4730 Sleep apnea, unspecified: Secondary | ICD-10-CM | POA: Diagnosis not present

## 2015-09-25 DIAGNOSIS — M1712 Unilateral primary osteoarthritis, left knee: Principal | ICD-10-CM | POA: Diagnosis present

## 2015-09-25 DIAGNOSIS — Z880 Allergy status to penicillin: Secondary | ICD-10-CM | POA: Diagnosis not present

## 2015-09-25 HISTORY — PX: KNEE ARTHROPLASTY: SHX992

## 2015-09-25 LAB — GLUCOSE, CAPILLARY
GLUCOSE-CAPILLARY: 240 mg/dL — AB (ref 65–99)
GLUCOSE-CAPILLARY: 187 mg/dL — AB (ref 65–99)
Glucose-Capillary: 139 mg/dL — ABNORMAL HIGH (ref 65–99)
Glucose-Capillary: 137 mg/dL — ABNORMAL HIGH (ref 65–99)

## 2015-09-25 SURGERY — ARTHROPLASTY, KNEE, TOTAL, USING IMAGELESS COMPUTER-ASSISTED NAVIGATION
Anesthesia: Spinal | Laterality: Left

## 2015-09-25 MED ORDER — PANTOPRAZOLE SODIUM 40 MG PO TBEC
40.0000 mg | DELAYED_RELEASE_TABLET | Freq: Two times a day (BID) | ORAL | Status: DC
  Administered 2015-09-25 – 2015-09-27 (×4): 40 mg via ORAL
  Filled 2015-09-25 (×4): qty 1

## 2015-09-25 MED ORDER — ADULT MULTIVITAMIN W/MINERALS CH
1.0000 | ORAL_TABLET | Freq: Every day | ORAL | Status: DC
  Administered 2015-09-25 – 2015-09-26 (×2): 1 via ORAL
  Filled 2015-09-25 (×2): qty 1

## 2015-09-25 MED ORDER — TRANEXAMIC ACID 1000 MG/10ML IV SOLN
1000.0000 mg | INTRAVENOUS | Status: AC
  Administered 2015-09-25: 1000 mg via INTRAVENOUS
  Filled 2015-09-25: qty 10

## 2015-09-25 MED ORDER — HYDROCHLOROTHIAZIDE 25 MG PO TABS
25.0000 mg | ORAL_TABLET | Freq: Every day | ORAL | Status: DC
  Administered 2015-09-25 – 2015-09-26 (×2): 25 mg via ORAL
  Filled 2015-09-25 (×3): qty 1

## 2015-09-25 MED ORDER — LOSARTAN POTASSIUM 50 MG PO TABS
100.0000 mg | ORAL_TABLET | Freq: Every day | ORAL | Status: DC
  Administered 2015-09-25 – 2015-09-26 (×2): 100 mg via ORAL
  Filled 2015-09-25 (×3): qty 2

## 2015-09-25 MED ORDER — METFORMIN HCL 500 MG PO TABS
500.0000 mg | ORAL_TABLET | Freq: Two times a day (BID) | ORAL | Status: DC
  Administered 2015-09-25 – 2015-09-27 (×4): 500 mg via ORAL
  Filled 2015-09-25 (×4): qty 1

## 2015-09-25 MED ORDER — PSYLLIUM 95 % PO PACK
1.0000 | PACK | Freq: Every day | ORAL | Status: DC
  Administered 2015-09-25 – 2015-09-26 (×2): 1 via ORAL
  Filled 2015-09-25 (×3): qty 1

## 2015-09-25 MED ORDER — SODIUM CHLORIDE 0.9 % IV SOLN
1000.0000 mg | Freq: Once | INTRAVENOUS | Status: AC
  Administered 2015-09-25: 1000 mg via INTRAVENOUS
  Filled 2015-09-25: qty 10

## 2015-09-25 MED ORDER — TETRACAINE HCL 1 % IJ SOLN
INTRAMUSCULAR | Status: DC | PRN
  Administered 2015-09-25: 8 mg via INTRASPINAL

## 2015-09-25 MED ORDER — MIDAZOLAM HCL 5 MG/5ML IJ SOLN
INTRAMUSCULAR | Status: DC | PRN
  Administered 2015-09-25 (×2): 1 mg via INTRAVENOUS

## 2015-09-25 MED ORDER — LOSARTAN POTASSIUM-HCTZ 100-25 MG PO TABS: 1.0000 | ORAL_TABLET | Freq: Every day | ORAL | Status: DC

## 2015-09-25 MED ORDER — BUPIVACAINE-EPINEPHRINE (PF) 0.25% -1:200000 IJ SOLN
INTRAMUSCULAR | Status: AC
  Filled 2015-09-25: qty 30

## 2015-09-25 MED ORDER — BUPIVACAINE-EPINEPHRINE 0.25% -1:200000 IJ SOLN
INTRAMUSCULAR | Status: DC | PRN
  Administered 2015-09-25: 30 mL

## 2015-09-25 MED ORDER — ONDANSETRON HCL 4 MG/2ML IJ SOLN: 4.0000 mg | Freq: Once | INTRAMUSCULAR | Status: DC | PRN

## 2015-09-25 MED ORDER — ACETAMINOPHEN 10 MG/ML IV SOLN
1000.0000 mg | Freq: Four times a day (QID) | INTRAVENOUS | Status: AC
  Administered 2015-09-25 – 2015-09-26 (×4): 1000 mg via INTRAVENOUS
  Filled 2015-09-25 (×4): qty 100

## 2015-09-25 MED ORDER — METOCLOPRAMIDE HCL 10 MG PO TABS
10.0000 mg | ORAL_TABLET | Freq: Three times a day (TID) | ORAL | Status: AC
  Administered 2015-09-25 – 2015-09-27 (×8): 10 mg via ORAL
  Filled 2015-09-25 (×8): qty 1

## 2015-09-25 MED ORDER — FAMOTIDINE 20 MG PO TABS
20.0000 mg | ORAL_TABLET | Freq: Once | ORAL | Status: AC
  Administered 2015-09-25: 20 mg via ORAL

## 2015-09-25 MED ORDER — DEXAMETHASONE SODIUM PHOSPHATE 10 MG/ML IJ SOLN
INTRAMUSCULAR | Status: DC | PRN
  Administered 2015-09-25: 10 mg via INTRAVENOUS

## 2015-09-25 MED ORDER — RISAQUAD PO CAPS
1.0000 | ORAL_CAPSULE | Freq: Every day | ORAL | Status: DC
  Administered 2015-09-26 – 2015-09-27 (×2): 1 via ORAL
  Filled 2015-09-25 (×2): qty 1

## 2015-09-25 MED ORDER — MAGNESIUM HYDROXIDE 400 MG/5ML PO SUSP
30.0000 mL | Freq: Every day | ORAL | Status: DC | PRN
  Administered 2015-09-27: 30 mL via ORAL
  Filled 2015-09-25: qty 30

## 2015-09-25 MED ORDER — LORATADINE 10 MG PO TABS
10.0000 mg | ORAL_TABLET | Freq: Every day | ORAL | Status: DC
  Administered 2015-09-26: 10 mg via ORAL
  Filled 2015-09-25 (×2): qty 1

## 2015-09-25 MED ORDER — ALUM & MAG HYDROXIDE-SIMETH 200-200-20 MG/5ML PO SUSP: 30.0000 mL | ORAL | Status: DC | PRN

## 2015-09-25 MED ORDER — ENOXAPARIN SODIUM 30 MG/0.3ML ~~LOC~~ SOLN
30.0000 mg | Freq: Two times a day (BID) | SUBCUTANEOUS | Status: DC
  Administered 2015-09-26 – 2015-09-27 (×3): 30 mg via SUBCUTANEOUS
  Filled 2015-09-25 (×3): qty 0.3

## 2015-09-25 MED ORDER — BUPIVACAINE LIPOSOME 1.3 % IJ SUSP
INTRAMUSCULAR | Status: AC
  Filled 2015-09-25: qty 20

## 2015-09-25 MED ORDER — CLOTRIMAZOLE 1 % EX CREA: 1.0000 "application " | TOPICAL_CREAM | Freq: Two times a day (BID) | CUTANEOUS | Status: DC | PRN

## 2015-09-25 MED ORDER — PHENYLEPHRINE HCL 10 MG/ML IJ SOLN
INTRAMUSCULAR | Status: DC | PRN
  Administered 2015-09-25 (×2): 100 ug via INTRAVENOUS

## 2015-09-25 MED ORDER — SODIUM CHLORIDE 0.9 % IV SOLN
INTRAVENOUS | Status: DC
  Administered 2015-09-25: 07:00:00 via INTRAVENOUS

## 2015-09-25 MED ORDER — AMLODIPINE BESYLATE 5 MG PO TABS
5.0000 mg | ORAL_TABLET | Freq: Every day | ORAL | Status: DC
  Administered 2015-09-25 – 2015-09-26 (×2): 5 mg via ORAL
  Filled 2015-09-25 (×2): qty 1

## 2015-09-25 MED ORDER — DIPHENHYDRAMINE HCL 12.5 MG/5ML PO ELIX: 12.5000 mg | ORAL_SOLUTION | ORAL | Status: DC | PRN

## 2015-09-25 MED ORDER — CLINDAMYCIN PHOSPHATE 600 MG/50ML IV SOLN
INTRAVENOUS | Status: AC
  Administered 2015-09-25: 600 mg
  Filled 2015-09-25: qty 50

## 2015-09-25 MED ORDER — EPHEDRINE SULFATE 50 MG/ML IJ SOLN
INTRAMUSCULAR | Status: DC | PRN
  Administered 2015-09-25 (×2): 5 mg via INTRAVENOUS

## 2015-09-25 MED ORDER — FENTANYL CITRATE (PF) 100 MCG/2ML IJ SOLN
INTRAMUSCULAR | Status: DC | PRN
  Administered 2015-09-25 (×4): 25 ug via INTRAVENOUS

## 2015-09-25 MED ORDER — SODIUM CHLORIDE 0.9 % IV SOLN
10000.0000 ug | INTRAVENOUS | Status: DC | PRN
  Administered 2015-09-25: 10 ug/min via INTRAVENOUS

## 2015-09-25 MED ORDER — VITAMIN D 1000 UNITS PO TABS
1000.0000 [IU] | ORAL_TABLET | Freq: Every day | ORAL | Status: DC
  Administered 2015-09-25 – 2015-09-26 (×2): 1000 [IU] via ORAL
  Filled 2015-09-25 (×2): qty 1

## 2015-09-25 MED ORDER — ACETAMINOPHEN 10 MG/ML IV SOLN
INTRAVENOUS | Status: AC
  Filled 2015-09-25: qty 100

## 2015-09-25 MED ORDER — MECLIZINE HCL 25 MG PO TABS: 25.0000 mg | ORAL_TABLET | ORAL | Status: DC | PRN

## 2015-09-25 MED ORDER — ACETAMINOPHEN 650 MG RE SUPP: 650.0000 mg | Freq: Four times a day (QID) | RECTAL | Status: DC | PRN

## 2015-09-25 MED ORDER — ONDANSETRON HCL 4 MG PO TABS: 4.0000 mg | ORAL_TABLET | Freq: Four times a day (QID) | ORAL | Status: DC | PRN

## 2015-09-25 MED ORDER — CLINDAMYCIN PHOSPHATE 600 MG/50ML IV SOLN
600.0000 mg | Freq: Four times a day (QID) | INTRAVENOUS | Status: AC
  Administered 2015-09-25 – 2015-09-26 (×4): 600 mg via INTRAVENOUS
  Filled 2015-09-25 (×4): qty 50

## 2015-09-25 MED ORDER — NEOMYCIN-POLYMYXIN B GU 40-200000 IR SOLN
Status: DC | PRN
  Administered 2015-09-25: 14 mL

## 2015-09-25 MED ORDER — HYDROCORTISONE 1 % EX CREA
1.0000 "application " | TOPICAL_CREAM | Freq: Three times a day (TID) | CUTANEOUS | Status: DC
  Administered 2015-09-25 – 2015-09-26 (×3): 1 via TOPICAL
  Filled 2015-09-25: qty 28

## 2015-09-25 MED ORDER — NEOMYCIN-POLYMYXIN B GU 40-200000 IR SOLN
Status: AC
  Filled 2015-09-25: qty 20

## 2015-09-25 MED ORDER — FAMOTIDINE 20 MG PO TABS
ORAL_TABLET | ORAL | Status: AC
  Administered 2015-09-25: 20 mg via ORAL
  Filled 2015-09-25: qty 1

## 2015-09-25 MED ORDER — BISACODYL 10 MG RE SUPP: 10.0000 mg | Freq: Every day | RECTAL | Status: DC | PRN

## 2015-09-25 MED ORDER — PROPRANOLOL HCL 40 MG PO TABS
40.0000 mg | ORAL_TABLET | Freq: Two times a day (BID) | ORAL | Status: DC
  Administered 2015-09-25 – 2015-09-26 (×3): 40 mg via ORAL
  Filled 2015-09-25 (×6): qty 1

## 2015-09-25 MED ORDER — SENNOSIDES-DOCUSATE SODIUM 8.6-50 MG PO TABS
1.0000 | ORAL_TABLET | Freq: Two times a day (BID) | ORAL | Status: DC
  Administered 2015-09-25 – 2015-09-27 (×5): 1 via ORAL
  Filled 2015-09-25 (×4): qty 1

## 2015-09-25 MED ORDER — LOPERAMIDE HCL 1 MG/5ML PO LIQD
1.0000 mg | ORAL | Status: DC | PRN
  Filled 2015-09-25: qty 10

## 2015-09-25 MED ORDER — ONDANSETRON HCL 4 MG/2ML IJ SOLN
4.0000 mg | Freq: Four times a day (QID) | INTRAMUSCULAR | Status: DC | PRN
Start: 2015-09-25 — End: 2015-09-27
  Administered 2015-09-26: 4 mg via INTRAVENOUS
  Filled 2015-09-25: qty 2

## 2015-09-25 MED ORDER — MENTHOL 3 MG MT LOZG: 1.0000 | LOZENGE | OROMUCOSAL | Status: DC | PRN

## 2015-09-25 MED ORDER — ONDANSETRON HCL 4 MG/2ML IJ SOLN
INTRAMUSCULAR | Status: DC | PRN
  Administered 2015-09-25: 4 mg via INTRAVENOUS

## 2015-09-25 MED ORDER — INSULIN ASPART 100 UNIT/ML ~~LOC~~ SOLN
0.0000 [IU] | Freq: Three times a day (TID) | SUBCUTANEOUS | Status: DC
  Administered 2015-09-25: 5 [IU] via SUBCUTANEOUS
  Administered 2015-09-26 (×2): 2 [IU] via SUBCUTANEOUS
  Administered 2015-09-26: 3 [IU] via SUBCUTANEOUS
  Administered 2015-09-27: 2 [IU] via SUBCUTANEOUS
  Administered 2015-09-27: 3 [IU] via SUBCUTANEOUS
  Filled 2015-09-25: qty 3
  Filled 2015-09-25: qty 5
  Filled 2015-09-25 (×2): qty 1
  Filled 2015-09-25: qty 2
  Filled 2015-09-25: qty 3

## 2015-09-25 MED ORDER — INSULIN ASPART 100 UNIT/ML ~~LOC~~ SOLN
0.0000 [IU] | Freq: Every day | SUBCUTANEOUS | Status: DC
Start: 2015-09-25 — End: 2015-09-27

## 2015-09-25 MED ORDER — SODIUM CHLORIDE 0.9 % IJ SOLN
INTRAMUSCULAR | Status: AC
  Filled 2015-09-25: qty 50

## 2015-09-25 MED ORDER — TRAMADOL HCL 50 MG PO TABS: 50.0000 mg | ORAL_TABLET | ORAL | Status: DC | PRN

## 2015-09-25 MED ORDER — PROPOFOL 500 MG/50ML IV EMUL
INTRAVENOUS | Status: DC | PRN
  Administered 2015-09-25: 50 ug/kg/min via INTRAVENOUS

## 2015-09-25 MED ORDER — MORPHINE SULFATE (PF) 2 MG/ML IV SOLN
2.0000 mg | INTRAVENOUS | Status: DC | PRN
  Administered 2015-09-25: 2 mg via INTRAVENOUS
  Filled 2015-09-25: qty 1

## 2015-09-25 MED ORDER — ACETAMINOPHEN 10 MG/ML IV SOLN
INTRAVENOUS | Status: DC | PRN
  Administered 2015-09-25: 1000 mg via INTRAVENOUS

## 2015-09-25 MED ORDER — CLINDAMYCIN PHOSPHATE 600 MG/50ML IV SOLN
600.0000 mg | Freq: Once | INTRAVENOUS | Status: AC
  Administered 2015-09-25: 600 mg via INTRAVENOUS

## 2015-09-25 MED ORDER — FENTANYL CITRATE (PF) 100 MCG/2ML IJ SOLN: 25.0000 ug | INTRAMUSCULAR | Status: DC | PRN

## 2015-09-25 MED ORDER — POLYVINYL ALCOHOL 1.4 % OP SOLN
1.0000 [drp] | OPHTHALMIC | Status: DC | PRN
  Filled 2015-09-25: qty 15

## 2015-09-25 MED ORDER — TETRACAINE HCL 1 % IJ SOLN
INTRAMUSCULAR | Status: AC
Start: 2015-09-25 — End: 2015-09-25
  Filled 2015-09-25: qty 2

## 2015-09-25 MED ORDER — CYANOCOBALAMIN 500 MCG PO TABS
500.0000 ug | ORAL_TABLET | ORAL | Status: DC
  Administered 2015-09-25: 500 ug via ORAL
  Filled 2015-09-25: qty 1

## 2015-09-25 MED ORDER — CALCIUM CARBONATE ANTACID 500 MG PO CHEW
2.0000 | CHEWABLE_TABLET | ORAL | Status: DC | PRN
  Filled 2015-09-25: qty 2

## 2015-09-25 MED ORDER — PIOGLITAZONE HCL 15 MG PO TABS
30.0000 mg | ORAL_TABLET | Freq: Every day | ORAL | Status: DC
  Administered 2015-09-25 – 2015-09-27 (×2): 30 mg via ORAL
  Filled 2015-09-25 (×3): qty 2

## 2015-09-25 MED ORDER — BUPIVACAINE HCL (PF) 0.5 % IJ SOLN
INTRAMUSCULAR | Status: DC | PRN
  Administered 2015-09-25: 2.4 mL

## 2015-09-25 MED ORDER — ACETAMINOPHEN 325 MG PO TABS
650.0000 mg | ORAL_TABLET | Freq: Four times a day (QID) | ORAL | Status: DC | PRN
  Administered 2015-09-26: 650 mg via ORAL
  Filled 2015-09-25: qty 2

## 2015-09-25 MED ORDER — LEVOTHYROXINE SODIUM 125 MCG PO TABS
125.0000 ug | ORAL_TABLET | Freq: Every day | ORAL | Status: DC
  Administered 2015-09-26 – 2015-09-27 (×2): 125 ug via ORAL
  Filled 2015-09-25 (×2): qty 1

## 2015-09-25 MED ORDER — SODIUM CHLORIDE 0.9 % IV SOLN
INTRAVENOUS | Status: DC | PRN
  Administered 2015-09-25: 60 mL

## 2015-09-25 MED ORDER — OXYCODONE HCL 5 MG PO TABS
5.0000 mg | ORAL_TABLET | ORAL | Status: DC | PRN
  Administered 2015-09-25: 10 mg via ORAL
  Administered 2015-09-25 – 2015-09-26 (×3): 5 mg via ORAL
  Administered 2015-09-26: 10 mg via ORAL
  Administered 2015-09-27: 5 mg via ORAL
  Filled 2015-09-25: qty 1
  Filled 2015-09-25 (×2): qty 2
  Filled 2015-09-25 (×2): qty 1
  Filled 2015-09-25: qty 2

## 2015-09-25 MED ORDER — SODIUM CHLORIDE 0.9 % IV SOLN
INTRAVENOUS | Status: DC
  Administered 2015-09-25 – 2015-09-26 (×2): via INTRAVENOUS

## 2015-09-25 MED ORDER — FLEET ENEMA 7-19 GM/118ML RE ENEM
1.0000 | ENEMA | Freq: Once | RECTAL | Status: DC | PRN
Start: 2015-09-25 — End: 2015-09-27

## 2015-09-25 MED ORDER — PHENOL 1.4 % MT LIQD: 1.0000 | OROMUCOSAL | Status: DC | PRN

## 2015-09-25 MED ORDER — FERROUS SULFATE 325 (65 FE) MG PO TABS
325.0000 mg | ORAL_TABLET | Freq: Two times a day (BID) | ORAL | Status: DC
  Administered 2015-09-25 – 2015-09-27 (×4): 325 mg via ORAL
  Filled 2015-09-25 (×4): qty 1

## 2015-09-25 SURGICAL SUPPLY — 69 items
AUTOTRANSFUS HAS 1/8 (MISCELLANEOUS) ×3
BATTERY INSTRU NAVIGATION (MISCELLANEOUS) ×12 IMPLANT
BLADE SAW 1 (BLADE) ×3 IMPLANT
BLADE SAW 1/2 (BLADE) ×3 IMPLANT
BONE CEMENT GENTAMICIN (Cement) ×6 IMPLANT
CANISTER SUCT 1200ML W/VALVE (MISCELLANEOUS) ×3 IMPLANT
CANISTER SUCT 3000ML (MISCELLANEOUS) ×6 IMPLANT
CAP KNEE TOTAL 3 SIGMA ×3 IMPLANT
CATH TRAY METER 16FR LF (MISCELLANEOUS) ×3 IMPLANT
CEMENT BONE GENTAMICIN 40 (Cement) ×2 IMPLANT
COOLER POLAR GLACIER W/PUMP (MISCELLANEOUS) IMPLANT
CUFF TOURN 24 STER (MISCELLANEOUS) IMPLANT
CUFF TOURN 30 STER DUAL PORT (MISCELLANEOUS) IMPLANT
CUFF TOURN SGL QUICK 34 (TOURNIQUET CUFF) ×2
CUFF TRNQT CYL 34X4X40X1 (TOURNIQUET CUFF) ×1 IMPLANT
DECANTER SPIKE VIAL GLASS SM (MISCELLANEOUS) ×3 IMPLANT
DRAPE INCISE 23X17 IOBAN STRL (DRAPES) ×2
DRAPE INCISE IOBAN 23X17 STRL (DRAPES) ×1 IMPLANT
DRAPE SHEET LG 3/4 BI-LAMINATE (DRAPES) ×3 IMPLANT
DRSG DERMACEA 8X12 NADH (GAUZE/BANDAGES/DRESSINGS) ×3 IMPLANT
DRSG OPSITE POSTOP 4X14 (GAUZE/BANDAGES/DRESSINGS) ×3 IMPLANT
DRSG TEGADERM 4X4.75 (GAUZE/BANDAGES/DRESSINGS) ×3 IMPLANT
DURAPREP 26ML APPLICATOR (WOUND CARE) ×6 IMPLANT
ELECT CAUTERY BLADE 6.4 (BLADE) ×3 IMPLANT
ELECT REM PT RETURN 9FT ADLT (ELECTROSURGICAL) ×3
ELECTRODE REM PT RTRN 9FT ADLT (ELECTROSURGICAL) ×1 IMPLANT
EX-PIN ORTHOLOCK NAV 4X150 (PIN) ×6 IMPLANT
GLOVE BIO SURGEON STRL SZ 6.5 (GLOVE) ×2 IMPLANT
GLOVE BIO SURGEONS STRL SZ 6.5 (GLOVE) ×1
GLOVE BIOGEL M STRL SZ7.5 (GLOVE) ×3 IMPLANT
GLOVE BIOGEL PI IND STRL 7.0 (GLOVE) ×1 IMPLANT
GLOVE BIOGEL PI INDICATOR 7.0 (GLOVE) ×2
GLOVE INDICATOR 7.5 STRL GRN (GLOVE) ×3 IMPLANT
GLOVE INDICATOR 8.0 STRL GRN (GLOVE) ×3 IMPLANT
GLOVE SURG 9.0 ORTHO LTXF (GLOVE) ×3 IMPLANT
GLOVE SURG ORTHO 9.0 STRL STRW (GLOVE) ×3 IMPLANT
GOWN STRL REUS W/ TWL LRG LVL3 (GOWN DISPOSABLE) ×2 IMPLANT
GOWN STRL REUS W/TWL 2XL LVL3 (GOWN DISPOSABLE) ×3 IMPLANT
GOWN STRL REUS W/TWL LRG LVL3 (GOWN DISPOSABLE) ×4
HANDPIECE SUCTION TUBG SURGILV (MISCELLANEOUS) ×3 IMPLANT
HOLDER FOLEY CATH W/STRAP (MISCELLANEOUS) ×3 IMPLANT
HOOD PEEL AWAY FLYTE STAYCOOL (MISCELLANEOUS) ×6 IMPLANT
KIT RM TURNOVER STRD PROC AR (KITS) ×3 IMPLANT
KNIFE SCULPS 14X20 (INSTRUMENTS) ×3 IMPLANT
NDL SAFETY 18GX1.5 (NEEDLE) ×3 IMPLANT
NEEDLE SPNL 20GX3.5 QUINCKE YW (NEEDLE) ×3 IMPLANT
NS IRRIG 500ML POUR BTL (IV SOLUTION) ×3 IMPLANT
PACK TOTAL KNEE (MISCELLANEOUS) ×3 IMPLANT
PAD WRAPON POLAR KNEE (MISCELLANEOUS) ×1 IMPLANT
PIN DRILL QUICK PACK ×3 IMPLANT
PIN FIXATION 1/8DIA X 3INL (PIN) ×3 IMPLANT
SOL .9 NS 3000ML IRR  AL (IV SOLUTION) ×2
SOL .9 NS 3000ML IRR UROMATIC (IV SOLUTION) ×1 IMPLANT
SOL PREP PVP 2OZ (MISCELLANEOUS) ×3
SOLUTION PREP PVP 2OZ (MISCELLANEOUS) ×1 IMPLANT
SPONGE DRAIN TRACH 4X4 STRL 2S (GAUZE/BANDAGES/DRESSINGS) ×3 IMPLANT
STAPLER SKIN PROX 35W (STAPLE) ×3 IMPLANT
SUCTION FRAZIER HANDLE 10FR (MISCELLANEOUS) ×2
SUCTION TUBE FRAZIER 10FR DISP (MISCELLANEOUS) ×1 IMPLANT
SUT VIC AB 0 CT1 36 (SUTURE) ×3 IMPLANT
SUT VIC AB 1 CT1 36 (SUTURE) ×6 IMPLANT
SUT VIC AB 2-0 CT2 27 (SUTURE) ×3 IMPLANT
SYR 20CC LL (SYRINGE) ×3 IMPLANT
SYR 30ML LL (SYRINGE) ×3 IMPLANT
SYR 50ML LL SCALE MARK (SYRINGE) ×3 IMPLANT
SYSTEM AUTOTRANSFUS DUAL TROCR (MISCELLANEOUS) ×1 IMPLANT
TOWEL OR 17X26 4PK STRL BLUE (TOWEL DISPOSABLE) ×3 IMPLANT
TOWER CARTRIDGE SMART MIX (DISPOSABLE) ×3 IMPLANT
WRAPON POLAR PAD KNEE (MISCELLANEOUS) ×3

## 2015-09-25 NOTE — Op Note (Signed)
OPERATIVE NOTE  DATE OF SURGERY:  09/25/2015  PATIENT NAME:  Cindy Wolfe   DOB: Apr 12, 1945  MRN: [id number]  PRE-OPERATIVE DIAGNOSIS: Degenerative arthrosis of the left knee, primary  POST-OPERATIVE DIAGNOSIS:  Same  PROCEDURE:  Left total knee arthroplasty using computer-assisted navigation  SURGEON:  Jena Gauss. M.D.  ASSISTANT:  Van Clines, PA (present and scrubbed throughout the case, critical for assistance with exposure, retraction, instrumentation, and closure)  ANESTHESIA: spinal  ESTIMATED BLOOD LOSS: 50 mL  FLUIDS REPLACED: 1500 mL of crystalloid  TOURNIQUET TIME: 112 minutes  DRAINS: 2 medium drains to a reinfusion system  SOFT TISSUE RELEASES: Anterior cruciate ligament, posterior cruciate ligament, deep and superficial medial collateral ligament, patellofemoral ligament   IMPLANTS UTILIZED: DePuy PFC Sigma size 4N (narrow) posterior stabilized femoral component (cemented), size 3 MBT tibial component (cemented), 38 mm 3 peg oval dome patella (cemented), and a 10 mm stabilized rotating platform polyethylene insert.  INDICATIONS FOR SURGERY: Cindy Wolfe is a 70 y.o. year old female with a long history of progressive knee pain. X-rays demonstrated severe degenerative changes in tricompartmental fashion. The patient had not seen any significant improvement despite conservative nonsurgical intervention. After discussion of the risks and benefits of surgical intervention, the patient expressed understanding of the risks benefits and agree with plans for total knee arthroplasty.   The risks, benefits, and alternatives were discussed at length including but not limited to the risks of infection, bleeding, nerve injury, stiffness, blood clots, the need for revision surgery, cardiopulmonary complications, among others, and they were willing to proceed.  PROCEDURE IN DETAIL: The patient was brought into the operating room and, after adequate spinal  anesthesia was achieved, a tourniquet was placed on the patient's upper thigh. The patient's knee and leg were cleaned and prepped with alcohol and DuraPrep and draped in the usual sterile fashion. A "timeout" was performed as per usual protocol. The lower extremity was exsanguinated using an Esmarch, and the tourniquet was inflated to 300 mmHg. An anterior longitudinal incision was made followed by a standard mid vastus approach. The deep fibers of the medial collateral ligament were elevated in a subperiosteal fashion off of the medial flare of the tibia so as to maintain a continuous soft tissue sleeve. The patella was subluxed laterally and the patellofemoral ligament was incised. Inspection of the knee demonstrated severe degenerative changes with full-thickness loss of articular cartilage. Osteophytes were debrided using a rongeur. Anterior and posterior cruciate ligaments were excised. Two 4.0 mm Schanz pins were inserted in the femur and into the tibia for attachment of the array of trackers used for computer-assisted navigation. Hip center was identified using a circumduction technique. Distal landmarks were mapped using the computer. The distal femur and proximal tibia were mapped using the computer. The distal femoral cutting guide was positioned using computer-assisted navigation so as to achieve a 5 distal valgus cut. The femur was sized and it was felt that a size 4N (narrow) femoral component was appropriate. A size 4 femoral cutting guide was positioned and the anterior cut was performed and verified using the computer. This was followed by completion of the posterior and chamfer cuts. Femoral cutting guide for the central box was then positioned in the center box cut was performed.  Attention was then directed to the proximal tibia. Medial and lateral menisci were excised. The extramedullary tibial cutting guide was positioned using computer-assisted navigation so as to achieve a 0 varus-valgus  alignment and 0 posterior slope. The cut  was performed and verified using the computer. The proximal tibia was sized and it was felt that a size 3 tibial tray was appropriate. Tibial and femoral trials were inserted followed by insertion of a 10 mm polyethylene insert. The knee was tight both in flexion and extension. The trial components removed an additional 4 mm of bone was resected from the proximal tibia using the extra measures to include cutting guide. The cut was verified using the computer and trial components were reinserted. The knee was felt to be somewhat tight medially. A Cobb elevator was used to elevate the superficial fibers of the medial collateral ligament. This allowed for excellent mediolateral soft tissue balancing both in flexion and in full extension. Finally, the patella was cut and prepared so as to accommodate a 38 mm 3 peg oval dome patella. A patella trial was placed and the knee was placed through a range of motion with excellent patellar tracking appreciated. The femoral trial was removed after debridement of posterior osteophytes. The central post-hole for the tibial component was reamed followed by insertion of a keel punch. Tibial trials were then removed. Cut surfaces of bone were irrigated with copious amounts of normal saline with antibiotic solution using pulsatile lavage and then suctioned dry. Polymethylmethacrylate cement with gentamicin was prepared in the usual fashion using a vacuum mixer. Cement was applied to the cut surface of the proximal tibia as well as along the undersurface of a size 3 MBT tibial component. Tibial component was positioned and impacted into place. Excess cement was removed using Personal assistantreer elevators. Cement was then applied to the cut surfaces of the femur as well as along the posterior flanges of the size 4N (narrow) femoral component. The femoral component was positioned and impacted into place. Excess cement was removed using Personal assistantreer elevators. A 10 mm  polyethylene trial was inserted and the knee was brought into full extension with steady axial compression applied. Finally, cement was applied to the backside of a 38 mm 3 peg oval dome patella and the patellar component was positioned and patellar clamp applied. Excess cement was removed using Personal assistantreer elevators. After adequate curing of the cement, the tourniquet was deflated after a total tourniquet time of 112 minutes. Hemostasis was achieved using electrocautery. The knee was irrigated with copious amounts of normal saline with antibiotic solution using pulsatile lavage and then suctioned dry. 20 mL of 1.3% Exparel in 40 mL of normal saline was injected along the posterior capsule, medial and lateral gutters, and along the arthrotomy site. A 10 mm stabilized rotating platform polyethylene insert was inserted and the knee was placed through a range of motion with excellent mediolateral soft tissue balancing appreciated and excellent patellar tracking noted. 2 medium drains were placed in the wound bed and brought out through separate stab incisions to be attached to a reinfusion system. The medial parapatellar portion of the incision was reapproximated using interrupted sutures of #1 Vicryl. Subcutaneous tissue was then injected with a total of 30 cc of 0.25% Marcaine with epinephrine. Subcutaneous tissue was approximated in layers using first #0 Vicryl followed #2-0 Vicryl. The skin was approximated with skin staples. A sterile dressing was applied.  The patient tolerated the procedure well and was transported to the recovery room in stable condition.    James P. Angie FavaHooten, Jr., M.D.

## 2015-09-25 NOTE — NC FL2 (Signed)
[place] MEDICAID FL2 LEVEL OF CARE SCREENING TOOL     IDENTIFICATION  Patient Name: Cindy Wolfe Birthdate: 1945-10-03 Sex: female Admission Date (Current Location): 09/25/2015  [place] and [place] Number:  Chiropodist and Address:  [facility], [address] [place], [place] [address]      Provider Number: [id number]  Attending Physician Name and Address:  Donato Heinz, MD  Relative Name and Phone Number:       Current Level of Care: Hospital Recommended Level of Care: Skilled Nursing Facility Prior Approval Number:    Date Approved/Denied:   PASRR Number:  ( [id number] )  Discharge Plan: SNF    Current Diagnoses: Patient Active Problem List   Diagnosis Date Noted  . S/P total knee arthroplasty 09/25/2015   Hyperlipidemia, unspecified 05/20/2015  Status post total right knee replacement 06/29/2014  RBBB 05/02/2014  Type 2 diabetes mellitus without complication (CMS-HCC) 04/20/2014  Acquired hypothyroidism, unspecified 04/20/2014  IBS (irritable bowel syndrome) 11/16/2013  Arthritis 09/25/2013  Essential hypertension, benign 09/25/2013  Sleep apnea 09/04/2013  Chronic diarrhea, unspecified      Orientation RESPIRATION BLADDER Height & Weight     Self, Time, Situation, Place  Normal Continent Weight:   Height:     BEHAVIORAL SYMPTOMS/MOOD NEUROLOGICAL BOWEL NUTRITION STATUS   (none )  (none ) Continent Diet (Diet: Clear Liquid )  AMBULATORY STATUS COMMUNICATION OF NEEDS Skin   Extensive Assist Verbally Surgical wounds (Incision: Left Knee )                       Personal Care Assistance Level of Assistance  Bathing, Feeding, Dressing Bathing Assistance: Limited assistance Feeding assistance: Independent Dressing Assistance: Limited assistance     Functional Limitations Info  Sight, Hearing, Speech Sight Info: Adequate Hearing Info: Adequate Speech Info: Adequate    SPECIAL CARE FACTORS  FREQUENCY  PT (By licensed PT), OT (By licensed OT)     PT Frequency:  (5) OT Frequency:  (5)            Contractures      Additional Factors Info  Code Status, Allergies, Insulin Sliding Scale Code Status Info:  (Full Code. ) Allergies Info:  (Prednisone, Adhesive, Penicillins, Sulfa Antibiotics)   Insulin Sliding Scale Info:  (NovoLog Insulin Injections 3 times daily )       Current Medications (09/25/2015):  This is the current hospital active medication list Current Facility-Administered Medications  Medication Dose Route Frequency Provider Last Rate Last Dose  . 0.9 %  sodium chloride infusion   Intravenous Continuous Donato Heinz, MD      . acetaminophen (OFIRMEV) IV 1,000 mg  1,000 mg Intravenous Q6H Donato Heinz, MD      . acetaminophen (TYLENOL) tablet 650 mg  650 mg Oral Q6H PRN Donato Heinz, MD       Or  . acetaminophen (TYLENOL) suppository 650 mg  650 mg Rectal Q6H PRN Donato Heinz, MD      . Melene Muller ON 09/26/2015] ALIGN CAPS 4 mg  4 mg Oral Q breakfast Donato Heinz, MD      . alum & mag hydroxide-simeth (MAALOX/MYLANTA) 200-200-20 MG/5ML suspension 30 mL  30 mL Oral Q4H PRN Donato Heinz, MD      . amLODipine (NORVASC) tablet 5 mg  5 mg Oral Q supper Donato Heinz, MD      . bisacodyl (DULCOLAX) suppository 10 mg  10 mg  Rectal Daily PRN Donato Heinz, MD      . calcium carbonate (TUMS - dosed in mg elemental calcium) chewable tablet 400 mg of elemental calcium  2 tablet Oral PRN Donato Heinz, MD      . cholecalciferol (VITAMIN D) tablet 1,000 Units  1,000 Units Oral Q supper Donato Heinz, MD      . clindamycin (CLEOCIN) 600 MG/50ML IVPB           . clindamycin (CLEOCIN) IVPB 600 mg  600 mg Intravenous Q6H Donato Heinz, MD      . clotrimazole (LOTRIMIN) 1 % cream 1 application  1 application Topical BID PRN Donato Heinz, MD      . cyanocobalamin tablet 500 mcg  500 mcg Oral QODAY Donato Heinz, MD      . diphenhydrAMINE (BENADRYL) 12.5 MG/5ML  elixir 12.5-25 mg  12.5-25 mg Oral Q4H PRN Donato Heinz, MD      . Melene Muller ON 09/26/2015] enoxaparin (LOVENOX) injection 30 mg  30 mg Subcutaneous Q12H Donato Heinz, MD      . ferrous sulfate tablet 325 mg  325 mg Oral BID WC Donato Heinz, MD      . losartan (COZAAR) tablet 100 mg  100 mg Oral Daily Donato Heinz, MD       And  . hydrochlorothiazide (HYDRODIURIL) tablet 25 mg  25 mg Oral Daily Donato Heinz, MD      . hydrocortisone cream 1 % 1 application  1 application Topical TID Donato Heinz, MD      . hydroxypropyl methylcellulose / hypromellose (ISOPTO TEARS / GONIOVISC) 2.5 % ophthalmic solution 1 drop  1 drop Both Eyes PRN Donato Heinz, MD      . insulin aspart (novoLOG) injection 0-15 Units  0-15 Units Subcutaneous TID WC Donato Heinz, MD      . insulin aspart (novoLOG) injection 0-5 Units  0-5 Units Subcutaneous QHS Donato Heinz, MD      . Melene Muller ON 09/26/2015] levothyroxine (SYNTHROID, LEVOTHROID) tablet 125 mcg  125 mcg Oral QAC breakfast Donato Heinz, MD      . loperamide (IMODIUM) 1 MG/5ML solution 1 mg  1 mg Oral PRN Donato Heinz, MD      . loratadine (CLARITIN) tablet 10 mg  10 mg Oral Daily Donato Heinz, MD      . magnesium hydroxide (MILK OF MAGNESIA) suspension 30 mL  30 mL Oral Daily PRN Donato Heinz, MD      . meclizine (ANTIVERT) tablet 25 mg  25 mg Oral PRN Donato Heinz, MD      . menthol-cetylpyridinium (CEPACOL) lozenge 3 mg  1 lozenge Oral PRN Donato Heinz, MD       Or  . phenol (CHLORASEPTIC) mouth spray 1 spray  1 spray Mouth/Throat PRN Donato Heinz, MD      . metFORMIN (GLUCOPHAGE) tablet 500 mg  500 mg Oral BID WC Donato Heinz, MD      . metoCLOPramide (REGLAN) tablet 10 mg  10 mg Oral TID AC & HS Donato Heinz, MD      . morphine 2 MG/ML injection 2 mg  2 mg Intravenous Q2H PRN Donato Heinz, MD      . multivitamin with minerals tablet 1 tablet  1 tablet Oral Q supper Donato Heinz, MD      . ondansetron [facility]) tablet 4 mg  4 mg  Oral Q6H PRN Donato HeinzJames P Hooten, MD       Or  . ondansetron (ZOFRAN) injection 4 mg  4 mg Intravenous Q6H PRN Donato HeinzJames P Hooten, MD      . oxyCODONE (Oxy IR/ROXICODONE) immediate release tablet 5-10 mg  5-10 mg Oral Q4H PRN Donato HeinzJames P Hooten, MD      . pantoprazole (PROTONIX) EC tablet 40 mg  40 mg Oral BID Donato HeinzJames P Hooten, MD      . pioglitazone (ACTOS) tablet 30 mg  30 mg Oral Daily Donato HeinzJames P Hooten, MD      . propranolol (INDERAL) tablet 40 mg  40 mg Oral BID Donato HeinzJames P Hooten, MD      . psyllium (HYDROCIL/METAMUCIL) packet 1 packet  1 packet Oral Q lunch Donato HeinzJames P Hooten, MD      . senna-docusate (Senokot-S) tablet 1 tablet  1 tablet Oral BID Donato HeinzJames P Hooten, MD      . sodium phosphate (FLEET) 7-19 GM/118ML enema 1 enema  1 enema Rectal Once PRN Donato HeinzJames P Hooten, MD      . traMADol Janean Sark(ULTRAM) tablet 50-100 mg  50-100 mg Oral Q4H PRN Donato HeinzJames P Hooten, MD         Discharge Medications: Please see discharge summary for a list of discharge medications.  Relevant Imaging Results:  Relevant Lab Results:   Additional Information  (SSN: [id number])  Haig ProphetMorgan, Aeriel Boulay G, LCSW

## 2015-09-25 NOTE — Anesthesia Procedure Notes (Addendum)
Date/Time: 09/25/2015 7:34 AM Performed by: Johnna Acosta Pre-anesthesia Checklist: Patient identified, Emergency Drugs available, Suction available, Patient being monitored and Timeout performed Patient Re-evaluated:Patient Re-evaluated prior to inductionOxygen Delivery Method: Nasal cannula   Spinal Patient location during procedure: OR Start time: 09/25/2015 7:25 AM End time: 09/25/2015 7:31 AM Staffing Anesthesiologist: Alvin Critchley Resident/CRNA: Johnna Acosta Performed by: resident/CRNA  Preanesthetic Checklist Completed: patient identified, site marked, surgical consent, pre-op evaluation, timeout performed, IV checked, risks and benefits discussed and monitors and equipment checked Spinal Block Patient position: sitting Prep: Betadine Patient monitoring: heart rate, continuous pulse ox, blood pressure and cardiac monitor Approach: midline Location: L3-4 Injection technique: single-shot Needle Needle type: Whitacre and Introducer  Needle gauge: 24 G Needle length: 10 cm Needle insertion depth: 9 cm Assessment Sensory level: T8 Additional Notes Negative paresthesia. Negative blood return. Positive free-flowing CSF. Expiration date of kit checked and confirmed. Patient tolerated procedure well, without complications.

## 2015-09-25 NOTE — Progress Notes (Signed)
PT Cancellation Note  Patient Details Name: Cindy Wolfe MRN: [id number] DOB: 1946-03-02   Cancelled Treatment:    Reason Eval/Treat Not Completed: Medical issues which prohibited therapy. Patient reports she is unable to feel her LEs still, thus unable to participate in PT evaluation. PT will re-attempt at later date/time as patient is available and appropriate.   Kerin RansomPatrick A McNamara, PT, DPT    09/25/2015, 3:18 PM

## 2015-09-25 NOTE — Care Management (Signed)
Home health list left at patient's bedside (room 156). RNCM will follow up with patient tomorrow. PT pending.

## 2015-09-25 NOTE — Transfer of Care (Signed)
Immediate Anesthesia Transfer of Care Note  Patient: Cindy Wolfe  Procedure(s) Performed: Procedure(s): COMPUTER ASSISTED TOTAL KNEE ARTHROPLASTY (Left)  Patient Location: PACU  Anesthesia Type:General  Level of Consciousness: awake, alert  and oriented  Airway & Oxygen Therapy: Patient Spontanous Breathing and Patient connected to nasal cannula oxygen  Post-op Assessment: Report given to RN and Post -op Vital signs reviewed and stable  Post vital signs: Reviewed and stable  Last Vitals:  Filed Vitals:   09/25/15 0612 09/25/15 1124  BP: 159/59 98/51  Pulse: 66 65  Temp: 36.4 C 36.5 C  Resp: 14 17    Last Pain: There were no vitals filed for this visit.       Complications: No apparent anesthesia complications

## 2015-09-25 NOTE — Anesthesia Preprocedure Evaluation (Signed)
Anesthesia Evaluation  Patient identified by MRN, date of birth, ID band Patient awake    Reviewed: Allergy & Precautions, Patient's Chart, lab work & pertinent test results, reviewed documented beta blocker date and time   History of Anesthesia Complications (+) PONV  Airway Mallampati: II  TM Distance: <3 FB   Mouth opening: Limited Mouth Opening  Dental  (+) Partial Upper   Pulmonary sleep apnea and Continuous Positive Airway Pressure Ventilation , former smoker,    Pulmonary exam normal        Cardiovascular hypertension, Pt. on home beta blockers and Pt. on medications Normal cardiovascular exam     Neuro/Psych negative neurological ROS  negative psych ROS   GI/Hepatic negative GI ROS, Neg liver ROS,   Endo/Other  diabetes, Well Controlled, Type 2, Oral Hypoglycemic AgentsHypothyroidism   Renal/GU negative Renal ROS  negative genitourinary   Musculoskeletal  (+) Arthritis , Osteoarthritis,    Abdominal Normal abdominal exam  (+)   Peds negative pediatric ROS (+)  Hematology negative hematology ROS (+)   Anesthesia Other Findings   Reproductive/Obstetrics                             Anesthesia Physical Anesthesia Plan  ASA: III  Anesthesia Plan: Spinal   Post-op Pain Management:    Induction: Intravenous  Airway Management Planned: Nasal Cannula  Additional Equipment:   Intra-op Plan:   Post-operative Plan:   Informed Consent: I have reviewed the patients History and Physical, chart, labs and discussed the procedure including the risks, benefits and alternatives for the proposed anesthesia with the patient or authorized representative who has indicated his/her understanding and acceptance.   Dental advisory given  Plan Discussed with: CRNA and Surgeon  Anesthesia Plan Comments:         Anesthesia Quick Evaluation

## 2015-09-25 NOTE — Brief Op Note (Signed)
09/25/2015  11:45 AM  PATIENT:  Cindy Wolfe  70 y.o. female  PRE-OPERATIVE DIAGNOSIS:  degenerative arthrosis left knee  POST-OPERATIVE DIAGNOSIS:  degenerative arthrosis left knee  PROCEDURE:  Procedure(s): COMPUTER ASSISTED TOTAL KNEE ARTHROPLASTY (Left)  SURGEON:  Surgeon(s) and Role:    * Donato HeinzJames P Hooten, MD - Primary  ASSISTANTS: Cindy ClinesJon Wolfe, PA   ANESTHESIA:   spinal  EBL:  Total I/O In: 1500 [I.V.:1500] Out: 200 [Urine:150; Blood:50]  BLOOD ADMINISTERED:none  DRAINS: 2 medium drains to a reinfusion system   LOCAL MEDICATIONS USED:  MARCAINE    and OTHER Exparel  SPECIMEN:  No Specimen  DISPOSITION OF SPECIMEN:  N/A  COUNTS:  YES  TOURNIQUET: 112 minutes  DICTATION: .Dragon Dictation  PLAN OF CARE: Admit to inpatient   PATIENT DISPOSITION:  PACU - hemodynamically stable.   Delay start of Pharmacological VTE agent (>24hrs) due to surgical blood loss or risk of bleeding: yes

## 2015-09-25 NOTE — H&P (Signed)
The patient has been re-examined, and the chart reviewed, and there have been no interval changes to the documented history and physical.    The risks, benefits, and alternatives have been discussed at length. The patient expressed understanding of the risks benefits and agreed with plans for surgical intervention.  Oshae Simmering P. Odette Watanabe, Jr. M.D.    

## 2015-09-26 LAB — GLUCOSE, CAPILLARY
Glucose-Capillary: 166 mg/dL — ABNORMAL HIGH (ref 65–99)
Glucose-Capillary: 162 mg/dL — ABNORMAL HIGH (ref 65–99)
Glucose-Capillary: 149 mg/dL — ABNORMAL HIGH (ref 65–99)
Glucose-Capillary: 144 mg/dL — ABNORMAL HIGH (ref 65–99)

## 2015-09-26 LAB — BASIC METABOLIC PANEL
Anion gap: 8 (ref 5–15)
BUN: 14 mg/dL (ref 6–20)
CALCIUM: 8.6 mg/dL — AB (ref 8.9–10.3)
CO2: 28 mmol/L (ref 22–32)
CREATININE: 0.78 mg/dL (ref 0.44–1.00)
Chloride: 100 mmol/L — ABNORMAL LOW (ref 101–111)
GFR calc Af Amer: >60 mL/min (ref >60)
GLUCOSE: 169 mg/dL — AB (ref 65–99)
Potassium: 3.8 mmol/L (ref 3.5–5.1)
Sodium: 136 mmol/L (ref 135–145)

## 2015-09-26 LAB — CBC
HCT: 35.9 % (ref 35.0–47.0)
Hemoglobin: 12.3 g/dL (ref 12.0–16.0)
MCH: 33.6 pg (ref 26.0–34.0)
MCHC: 34.3 g/dL (ref 32.0–36.0)
MCV: 97.9 fL (ref 80.0–100.0)
PLATELETS: 248 10*3/uL (ref 150–440)
RBC: 3.66 MIL/uL — ABNORMAL LOW (ref 3.80–5.20)
RDW: 13.1 % (ref 11.5–14.5)
WBC: 12.3 10*3/uL — ABNORMAL HIGH (ref 3.6–11.0)

## 2015-09-26 MED ORDER — ENOXAPARIN SODIUM 40 MG/0.4ML ~~LOC~~ SOLN: 40.0000 mg | SUBCUTANEOUS | Status: DC

## 2015-09-26 MED ORDER — TRAMADOL HCL 50 MG PO TABS: 50.0000 mg | ORAL_TABLET | ORAL | Status: DC | PRN

## 2015-09-26 MED ORDER — OXYCODONE HCL 5 MG PO TABS: 5.0000 mg | ORAL_TABLET | ORAL | Status: DC | PRN

## 2015-09-26 NOTE — Progress Notes (Signed)
Clinical Child psychotherapistocial Worker (CSW) received SNF consult. PT is recommending home health. Please reconsult if future social work needs arise. CSW signing off.   Jetta LoutBailey Morgan, LCSW [phone]

## 2015-09-26 NOTE — Evaluation (Signed)
Physical Therapy Evaluation Patient Details Name: Cindy Wolfe MRN: [id number] DOB: Dec 27, 1945 Today's Date: 09/26/2015   History of Present Illness  Richrd SoxKathryn Scscepanski is a 69yo white female who comes to [facility] for elective L TKA after progressive functional decline. She reports R TKA in 2016. 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   Clinical Impression  Upon entry, the patient is received semirecumbent in bed, RN present. The pt is awake and agreeable to participate. No acute distress noted at this time. VSS during eval. Strength/MMT screening reveals focal impairment in the LLE consistent with typical postoperative day 1 weakness, effusion, and pain inhibition, but is able to perform most AROM, very limited range but without needed assistance. Functional mobility assessment demonstrates mild-moderate weakness, the pt now requiring supervision, AD and/or Additional time and effort to perform bed mobility, transfers, and gait, whereas the patient performs these with modified independence independently at baseline. Patient presenting with impairment of strength, range of motion, balance, and activity tolerance, limiting ability to perform ADL and mobility tasks at  baseline level of function. Patient will benefit from skilled intervention to address the above impairments and limitations, in order to restore to prior level of function, improve patient safety upon discharge, and to decrease falls risk.       Follow Up Recommendations Home health PT    Equipment Recommendations  None recommended by PT    Recommendations for Other Services       Precautions / Restrictions Precautions Precautions: Knee;Fall Required Braces or Orthoses: Knee Immobilizer - Left Knee Immobilizer - Left: Discontinue once straight leg raise with < 10 degree lag;On when out of bed or  walking Restrictions Weight Bearing Restrictions: Yes LLE Weight Bearing: Weight bearing as tolerated      Mobility  Bed Mobility Overal bed mobility: Modified Independent                Transfers Overall transfer level: Needs assistance Equipment used: Rolling walker (2 wheeled) Transfers: Sit to/from Stand Sit to Stand: Supervision         General transfer comment: Requires near maximal effort and distorted posture to perform. RW use appears safe and steady.   Ambulation/Gait Ambulation/Gait assistance: Supervision Ambulation Distance (Feet): 38 Feet Assistive device: Rolling walker (2 wheeled)     Gait velocity interpretation: <1.8 ft/sec, indicative of risk for recurrent falls    Stairs            Wheelchair Mobility    Modified Rankin (Stroke Patients Only)       Balance Overall balance assessment: Modified Independent;No apparent balance deficits (not formally assessed)                                           Pertinent Vitals/Pain Pain Assessment: 0-10 Pain Score: 5  Pain Location: Left knee  Pain Descriptors / Indicators: Aching Pain Intervention(s): Limited activity within patient's tolerance;Monitored during session;Premedicated before session;Patient requesting pain meds-RN notified;Ice applied    Home Living Family/patient expects to be discharged to:: Private residence Living Arrangements: Spouse/significant other Available Help at Discharge: Family Type of Home: House Home Access: Stairs to enter Entrance Stairs-Rails: Can reach both Entrance Stairs-Number of Steps: 3 Home Layout: One level Home Equipment: Walker - 2 wheels;Toilet riser;Cane - single point  Prior Function Level of Independence: Independent               Hand Dominance        Extremity/Trunk Assessment                         Communication   Communication: No difficulties  Cognition Arousal/Alertness:  Awake/alert Behavior During Therapy: WFL for tasks assessed/performed Overall Cognitive Status: Within Functional Limits for tasks assessed                      General Comments      Exercises Total Joint Exercises Ankle Circles/Pumps: AROM;Both;20 reps;Supine Quad Sets: Left;10 reps;AROM;Supine Short Arc Quad: AROM;Strengthening;Left;10 reps;Supine (assistance req for last 2) Heel Slides: AAROM;Left;10 reps;Supine Hip ABduction/ADduction: AROM;Left;10 reps;Supine Straight Leg Raises: Both;10 reps;Supine Goniometric ROM: 8-40 degrees Left knee flexion (limited by dressign and polar care wrap)       Assessment/Plan    PT Assessment Patient needs continued PT services  PT Diagnosis Difficulty walking;Abnormality of gait;Generalized weakness;Acute pain   PT Problem List Decreased strength;Decreased range of motion;Decreased activity tolerance;Decreased balance;Decreased mobility;Obesity;Pain  PT Treatment Interventions DME instruction;Gait training;Stair training;Functional mobility training;Therapeutic activities;Therapeutic exercise;Balance training;Patient/family education   PT Goals (Current goals can be found in the Care Plan section) Acute Rehab PT Goals Patient Stated Goal: Return to home, get stronger and more mobile independently  PT Goal Formulation: With patient Time For Goal Achievement: 10/10/15 Potential to Achieve Goals: Good    Frequency BID   Barriers to discharge        Co-evaluation               End of Session Equipment Utilized During Treatment: Gait belt Activity Tolerance: Patient tolerated treatment well Patient left: in chair;with call bell/phone within reach;with chair alarm set Nurse Communication: Other (comment) (IV pole partial occlusion)         Time: [id number] PT Time Calculation (min) (ACUTE ONLY): 39 min   Charges:   PT Evaluation $PT Eval Moderate Complexity: 1 Procedure PT Treatments $Therapeutic Exercise: 8-22  mins $Therapeutic Activity: 8-22 mins   PT G Codes:       10:17 AM, 09/26/2015 Rosamaria LintsAllan C Caydyn Sprung, PT, DPT PRN Physical Therapist - [facility] License # [id number] [phone](ASCOM)  [phone] (mobile)

## 2015-09-26 NOTE — Progress Notes (Signed)
   Subjective: 1 Day Post-Op Procedure(s) (LRB): COMPUTER ASSISTED TOTAL KNEE ARTHROPLASTY (Left) Patient reports pain as 5 on 0-10 scale.   Patient is well, and has had no acute complaints or problems We will start therapy today.  Plan is to go Home after hospital stay. no nausea and no vomiting Patient denies any chest pains or shortness of breath. Objective: Vital signs in last 24 hours: Temp:  [97.3 F (36.3 C)-98.2 F (36.8 C)] 97.4 F (36.3 C) (06/22 0457) Pulse Rate:  [58-73] 65 (06/22 0457) Resp:  [16-18] 18 (06/22 0457) BP: (98-153)/(47-75) 125/53 mmHg (06/22 0457) SpO2:  [92 %-100 %] 92 % (06/22 0457) Heels are non tender and elevated off the bed using rolled towels Intake/Output from previous day: 06/21 0701 - 06/22 0700 In: 3098.3 [P.O.:840; I.V.:1958.3; IV Piggyback:300] Out: 2125 [Urine:1875; Drains:200; Blood:50] Intake/Output this shift:    No results for input(s): HGB in the last 72 hours. No results for input(s): WBC, RBC, HCT, PLT in the last 72 hours. No results for input(s): NA, K, CL, CO2, BUN, CREATININE, GLUCOSE, CALCIUM in the last 72 hours. No results for input(s): LABPT, INR in the last 72 hours.  EXAM General - Patient is Alert, Appropriate and Oriented Extremity - Neurologically intact Neurovascular intact Sensation intact distally Intact pulses distally Dorsiflexion/Plantar flexion intact Compartment soft Dressing - dressing C/D/I Motor Function - intact, moving foot and toes well on exam. Not able to do SLR on own  Past Medical History  Diagnosis Date  . Complication of anesthesia     1 time I had a hard time waking up.  Marland Kitchen. PONV (postoperative nausea and vomiting)   . Family history of adverse reaction to anesthesia     daughter post op nausea and vomiting   . Diabetes mellitus without complication (HCC)   . Sleep apnea   . Vertigo   . Hypertension   . Hypothyroidism   . IBS (irritable bowel syndrome)   . Hemorrhoids   .  Arthritis     Assessment/Plan: 1 Day Post-Op Procedure(s) (LRB): COMPUTER ASSISTED TOTAL KNEE ARTHROPLASTY (Left) Active Problems:   S/P total knee arthroplasty  There is no weight on file to calculate BMI. Advance diet D/C IV fluids Plan for discharge tomorrow Discharge home with home health  Labs: pending DVT Prophylaxis - Lovenox, Foot Pumps and TED hose Weight-Bearing as tolerated to left leg D/C O2 and Pulse OX and try on Room Air Begin working on bowel movement Labs tomorrow am  Lynnda ShieldsJon R. [facility]Wolfe PA [facility] 09/26/2015, 7:25 AM

## 2015-09-26 NOTE — Care Management Note (Addendum)
Case Management Note  Patient Details  Name: Cindy Wolfe MRN: [id number] Date of Birth: 08-23-1945  Subjective/Objective:                   Met with patient to discuss discharge planning. She wants to return home with her husband at discharge. She would like to use [facility]. She has walker and bedside commode available at home. She uses Product/process development scientist for Rx [phone]. She has history of [facility] for rehab- PT is recommending HHPT.  Action/Plan: List of home health agencies left with patient. Referral to [facility]. Lovenox 82m #14 [place] [facility] will continue to follow.   Expected Discharge Date:                  Expected Discharge Plan:     In-House Referral:     Discharge planning Services  CM Consult  Post Acute Care Choice:  Home Health Choice offered to:  Patient  DME Arranged:    DME Agency:     [facility]  [facility]  Status of Service:  In process, will continue to follow  If discussed at Long Length of Stay Meetings, dates discussed:    Additional Comments: Lovenox $75  AMarshell Garfinkel RN 09/26/2015, 11:25 AM

## 2015-09-26 NOTE — Discharge Summary (Signed)
Physician Discharge Summary  Patient ID: Cindy Wolfe MRN: [id number] DOB/AGE: 1945/05/14 70 y.o.  Admit date: 09/25/2015 Discharge date: 09/27/2015  Admission Diagnoses:  CHRONIC PAIN LEFT KNEE   Discharge Diagnoses: Patient Active Problem List   Diagnosis Date Noted  . S/P total knee arthroplasty 09/25/2015    Past Medical History  Diagnosis Date  . Complication of anesthesia     1 time I had a hard time waking up.  Marland Kitchen. PONV (postoperative nausea and vomiting)   . Family history of adverse reaction to anesthesia     daughter post op nausea and vomiting   . Diabetes mellitus without complication (HCC)   . Sleep apnea   . Vertigo   . Hypertension   . Hypothyroidism   . IBS (irritable bowel syndrome)   . Hemorrhoids   . Arthritis      Transfusion: No transfusion given during this admission   Consultants (if any):  Case management for home health assistance  Discharged Condition: Improved  Hospital Course: Cindy LindenKathryn E Wolfe is an 70 y.o. female who was admitted 09/25/2015 with a diagnosis of degenerative arthrosis to left knee and went to the operating room on 09/25/2015 and underwent the above named procedures.    Surgeries:Procedure(s): COMPUTER ASSISTED TOTAL KNEE ARTHROPLASTY on 09/25/2015  PRE-OPERATIVE DIAGNOSIS: Degenerative arthrosis of the left knee, primary  POST-OPERATIVE DIAGNOSIS: Same  PROCEDURE: Left total knee arthroplasty using computer-assisted navigation  SURGEON: Jena GaussJames P Hooten, Jr. M.D.  ASSISTANT: Van ClinesJon Wolfe, PA (present and scrubbed throughout the case, critical for assistance with exposure, retraction, instrumentation, and closure)  ANESTHESIA: spinal  ESTIMATED BLOOD LOSS: 50 mL  FLUIDS REPLACED: 1500 mL of crystalloid  TOURNIQUET TIME: 112 minutes  DRAINS: 2 medium drains to a reinfusion system  SOFT TISSUE RELEASES: Anterior cruciate ligament, posterior cruciate ligament, deep and superficial medial collateral  ligament, patellofemoral ligament   IMPLANTS UTILIZED: DePuy PFC Sigma size 4N (narrow) posterior stabilized femoral component (cemented), size 3 MBT tibial component (cemented), 38 mm 3 peg oval dome patella (cemented), and a 10 mm stabilized rotating platform polyethylene insert.  INDICATIONS FOR SURGERY: Cindy Wolfe is a 70 y.o. year old female with a long history of progressive knee pain. X-rays demonstrated severe degenerative changes in tricompartmental fashion. The patient had not seen any significant improvement despite conservative nonsurgical intervention. After discussion of the risks and benefits of surgical intervention, the patient expressed understanding of the risks benefits and agree with plans for total knee arthroplasty.   The risks, benefits, and alternatives were discussed at length including but not limited to the risks of infection, bleeding, nerve injury, stiffness, blood clots, the need for revision surgery, cardiopulmonary complications, among others, and they were willing to proceed. Patient tolerated the surgery well. No complications .Patient was taken to PACU where she was stabilized and then transferred to the orthopedic floor.  Patient started on Lovenox 30 q 12 hrs. Foot pumps applied bilaterally at 80 mm hg. Heels elevated off bed with rolled towels. No evidence of DVT. Calves non tender. Negative Homan. Physical therapy started on day #1 for gait training and transfer with OT starting on  day #1 for ADL and assisted devices. Patient has done well with therapy. Ambulated greater 200 feet upon being discharged.  Patient's IV , foley and hemovac was d/c on day #2.   She was given perioperative antibiotics:  Anti-infectives    Start     Dose/Rate Route Frequency Ordered Stop   09/25/15 1400  clindamycin (CLEOCIN)  IVPB 600 mg     600 mg 100 mL/hr over 30 Minutes Intravenous Every 6 hours 09/25/15 1229 09/26/15 1359   09/25/15 0606  clindamycin (CLEOCIN)  600 MG/50ML IVPB    Comments:  Slemenda, Debbie: cabinet override      09/25/15 0606 09/25/15 1737   09/25/15 0230  clindamycin (CLEOCIN) IVPB 600 mg     600 mg 100 mL/hr over 30 Minutes Intravenous  Once 09/25/15 0226 09/25/15 0744    .  She was fitted with AV 1 compression foot pump devices,instructed on heel pumps, early ambulation, and fitted with TED stocking bilaterally for DVT prophylaxis.  She benefited maximally from the hospital stay and there were no complications.    Recent vital signs:  Filed Vitals:   09/26/15 0457 09/26/15 0757  BP: 125/53 120/55  Pulse: 65 62  Temp: 97.4 F (36.3 C) 97.6 F (36.4 C)  Resp: 18 16    Recent laboratory studies:  Lab Results  Component Value Date   HGB 13.7 04/25/2014   HGB 12.6 11/24/2011   Lab Results  Component Value Date   WBC 7.5 04/25/2014   PLT 301 04/25/2014   Lab Results  Component Value Date   INR 0.88 09/12/2015   Lab Results  Component Value Date   NA 139 04/25/2014   K 3.7 04/25/2014   CL 102 04/25/2014   CO2 34* 04/25/2014   BUN 15 04/25/2014   CREATININE 0.94 04/25/2014   GLUCOSE 117* 04/25/2014    Discharge Medications:     Medication List    TAKE these medications        acetaminophen 500 MG tablet  Commonly known as:  TYLENOL  Take 500-1,000 mg by mouth 2 (two) times daily. Take 2 tablets in the morning, and 1 tablet at dinner     ALIGN 4 MG Caps  Take 4 mg by mouth daily with breakfast.     amLODipine 5 MG tablet  Commonly known as:  NORVASC  Take 5 mg by mouth daily. Supper.     calcium carbonate 500 MG chewable tablet  Commonly known as:  TUMS - dosed in mg elemental calcium  Chew 2 tablets by mouth as needed for indigestion or heartburn.     carbamide peroxide 10 % solution  Commonly known as:  GLY-OXIDE  Place 1 application onto teeth as needed.     cholecalciferol 1000 units tablet  Commonly known as:  VITAMIN D  Take 1,000 Units by mouth daily with supper.      clotrimazole 1 % cream  Commonly known as:  LOTRIMIN  Apply 1 application topically as needed.     clotrimazole-betamethasone cream  Commonly known as:  LOTRISONE  Apply 1 application topically as needed (ears).     enoxaparin 40 MG/0.4ML injection  Commonly known as:  LOVENOX  Inject 0.4 mLs (40 mg total) into the skin daily.     fexofenadine 180 MG tablet  Commonly known as:  ALLEGRA  Take 180 mg by mouth as needed for allergies or rhinitis.     hydrocortisone cream 1 %  Apply 1 application topically as needed for itching.     hydroxypropyl methylcellulose / hypromellose 2.5 % ophthalmic solution  Commonly known as:  ISOPTO TEARS / GONIOVISC  Place 1 drop into both eyes as needed for dry eyes.     IMODIUM A-D 1 MG/7.5ML Liqd  Generic drug:  Loperamide HCl  Take 1 mg by mouth as needed.  levothyroxine 125 MCG tablet  Commonly known as:  SYNTHROID, LEVOTHROID  Take 125 mcg by mouth daily before breakfast.     losartan-hydrochlorothiazide 100-25 MG tablet  Commonly known as:  HYZAAR  Take 1 tablet by mouth daily. Q am.     meclizine 25 MG tablet  Commonly known as:  ANTIVERT  Take 25 mg by mouth as needed for dizziness.     metFORMIN 500 MG tablet  Commonly known as:  GLUCOPHAGE  Take 500 mg by mouth 2 (two) times daily with a meal.     multivitamins with iron Tabs tablet  Take 1 tablet by mouth daily. At supper.     nitrofurantoin 100 MG capsule  Commonly known as:  MACRODANTIN  Take 100 mg by mouth 4 (four) times daily.     oxyCODONE 5 MG immediate release tablet  Commonly known as:  Oxy IR/ROXICODONE  Take 1-2 tablets (5-10 mg total) by mouth every 4 (four) hours as needed for severe pain or breakthrough pain.     pioglitazone 30 MG tablet  Commonly known as:  ACTOS  Take 30 mg by mouth daily. In am.     propranolol 40 MG tablet  Commonly known as:  INDERAL  Take 40 mg by mouth 2 (two) times daily.     psyllium 95 % Pack  Commonly known as:   HYDROCIL/METAMUCIL  Take 1 packet by mouth daily with lunch.     traMADol 50 MG tablet  Commonly known as:  ULTRAM  Take 1-2 tablets (50-100 mg total) by mouth every 4 (four) hours as needed for moderate pain.     vitamin B-12 500 MCG tablet  Commonly known as:  CYANOCOBALAMIN  Take 500 mcg by mouth every other day. At supper.     white petrolatum Gel  Commonly known as:  VASELINE  Apply 1 application topically as needed (opthalmic).        Diagnostic Studies: Dg Knee Left Port  09/25/2015  CLINICAL DATA:  Left total knee replacement. EXAM: PORTABLE LEFT KNEE - 1-2 VIEW COMPARISON:  None. FINDINGS: Changes of left total knee replacement. No hardware or bony complicating feature. Soft tissue drains in place. IMPRESSION: Left knee replacement without visible complicating feature. Electronically Signed   By: Charlett NoseKevin  Dover M.D.   On: 09/25/2015 11:54    Disposition:       Discharge Instructions    Diet - low sodium heart healthy    Complete by:  As directed      Increase activity slowly    Complete by:  As directed            Follow-up Information    [facility] with [facility]ON R., PA On 10/10/2015.   Specialty:  Physician Assistant   Why:  at 9:15am   Contact information:   [address] [facility] [place] [address] [phone]       Follow up with Donato HeinzHOOTEN,JAMES P, MD On 11/07/2015.   Specialty:  Orthopedic Surgery   Why:  at 9:15am   Contact information:   [address] [facility] [place] [address] [phone]        Signed: Tera PartridgeWOLFE,JON R. 09/26/2015, 8:00 AM

## 2015-09-26 NOTE — Anesthesia Postprocedure Evaluation (Signed)
Anesthesia Post Note  Patient: Cindy Wolfe  Procedure(s) Performed: Procedure(s) (LRB): COMPUTER ASSISTED TOTAL KNEE ARTHROPLASTY (Left)  Patient location during evaluation: Nursing Unit Anesthesia Type: Regional Level of consciousness: awake Pain management: pain level controlled Vital Signs Assessment: post-procedure vital signs reviewed and stable Respiratory status: spontaneous breathing and respiratory function stable Cardiovascular status: blood pressure returned to baseline Postop Assessment: no headache Anesthetic complications: no Comments: Foley remains, has not been oob, moves both feet and legs    Last Vitals:  Filed Vitals:   09/25/15 2005 09/26/15 0457  BP: 118/59 125/53  Pulse: 59 65  Temp: 36.3 C 36.3 C  Resp:  18    Last Pain:  Filed Vitals:   09/26/15 0527  PainSc: 6                  Vernie MurdersPope,  Jamoni Hewes G

## 2015-09-26 NOTE — Progress Notes (Signed)
[facility]. Care of pt assumed.

## 2015-09-26 NOTE — Discharge Instructions (Signed)

## 2015-09-26 NOTE — Progress Notes (Signed)
Physical Therapy Treatment Patient Details Name: Cindy Wolfe MRN: [id number] DOB: 09-Mar-1946 Today's Date: 09/26/2015    History of Present Illness Richrd SoxKathryn Scscepanski is a 69yo white female [facility]. Pt has previously had a Right TKA in 2016. 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     PT Comments    Pt in chair with c/o nausea.  Nurse notified.  Returned later and pt with continued with c/o nausea.  She was able to ambulate a short distance to bed with supervision and bariatric walker.  Verbal cues to slow down and wait for staff as she seemed anxious to return to bed but overall safe and steady gait.  Required mod a x 1 for LE management to return to supine.  Given and returned demo of HEP for TKR.  She was able to do SLR x 10 without lag.     Follow Up Recommendations  Home health PT     Equipment Recommendations  None recommended by PT    Recommendations for Other Services       Precautions / Restrictions Precautions Precautions: Knee;Fall Knee Immobilizer - Left: Discontinue once straight leg raise with < 10 degree lag;Other (comment) (Pt able to x 10 SLR's without lag - KI not used for transfer) Restrictions Weight Bearing Restrictions: Yes LLE Weight Bearing: Weight bearing as tolerated    Mobility  Bed Mobility Overal bed mobility: Needs Assistance Bed Mobility: Sit to Supine       Sit to supine: Min assist (for LE management)      Transfers Overall transfer level: Needs assistance Equipment used: Rolling walker (2 wheeled) (bariatric) Transfers: Sit to/from Stand Sit to Stand: Supervision            Ambulation/Gait Ambulation/Gait assistance: Supervision Ambulation Distance (Feet): 10 Feet Assistive device: Rolling walker (2 wheeled) Gait Pattern/deviations: Step-through pattern;Decreased step length -  right;Decreased step length - left   Gait velocity interpretation: <1.8 ft/sec, indicative of risk for recurrent falls     Stairs            Wheelchair Mobility    Modified Rankin (Stroke Patients Only)       Balance Overall balance assessment: Modified Independent                                  Cognition Arousal/Alertness: Awake/alert Behavior During Therapy: WFL for tasks assessed/performed Overall Cognitive Status: Within Functional Limits for tasks assessed                      Exercises Total Joint Exercises Ankle Circles/Pumps: AROM;Both;20 reps;Supine Quad Sets: Left;10 reps;AROM;Supine Short Arc Quad: AROM;Strengthening;Left;10 reps;Supine Heel Slides: AROM;Left;10 reps;Supine Hip ABduction/ADduction: AROM;Left;10 reps;Supine Straight Leg Raises: AROM;10 reps;Supine;Left    General Comments        Pertinent Vitals/Pain Pain Assessment: 0-10 Pain Score: 5  Pain Location: L knee Pain Descriptors / Indicators: Aching Pain Intervention(s): Limited activity within patient's tolerance;Ice applied    Home Living                      Prior Function            PT Goals (current goals can now be found in the care plan section) Progress towards PT goals:  Progressing toward goals    Frequency  BID    PT Plan Current plan remains appropriate    Co-evaluation             End of Session   Activity Tolerance: Patient tolerated treatment well Patient left: in bed;with call bell/phone within reach;with bed alarm set;with family/visitor present     Time: [id number] PT Time Calculation (min) (ACUTE ONLY): 19 min  Charges:  $Gait Training: 8-22 mins $Therapeutic Exercise: 8-22 mins                    G Codes:      Danielle DessSarah Carlton Sweaney, PTA 09/26/2015, 3:04 PM

## 2015-09-26 NOTE — Evaluation (Signed)
Occupational Therapy Evaluation Patient Details Name: Cindy Wolfe MRN: [id number] DOB: 13-Jun-1945 Today's Date: 09/26/2015    History of Present Illness Cindy Wolfe is a 69yo white female who [facility] for [facility] L [facility]. Pt has previously had a Right TKA in 2016. 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    Clinical Impression   Pt. Is a 70 y.o. female who was admitted for a Left TKR. Pt presents with limited ROM, Pain, weakness, and impaired functional mobility which hinder her ability to complete ADL and IADL tasks. Pt. could benefit from skilled OT services to review A/E use for LE ADLs, to review necessary home modifications, and to improve functional mobility for ADL/IADLs in order to work towards regaining Independence with ADL/IADLs.     Follow Up Recommendations  No OT follow up    Equipment Recommendations       Recommendations for Other Services Rehab consult     Precautions / Restrictions Precautions Precautions: Knee;Fall Required Braces or Orthoses: Knee Immobilizer - Left Knee Immobilizer - Left: Discontinue once straight leg raise with < 10 degree lag;On when out of bed or walking Restrictions Weight Bearing Restrictions: Yes LLE Weight Bearing: Weight bearing as tolerated      Mobility Bed Mobility Overal bed mobility: Modified Independent                         Balance                                      ADL Overall ADL's : Needs assistance/impaired Eating/Feeding: Set up   Grooming: Set up           Upper Body Dressing : Supervision/safety   Lower Body Dressing: Minimal assistance               Functional mobility during ADLs: Min guard       Vision     Perception     Praxis      Pertinent Vitals/Pain Pain Assessment: 0-10 Pain Score: 5  Pain Location: Left knee   Pain Descriptors / Indicators: Aching Pain Intervention(s): Limited activity within patient's tolerance;Monitored during session;Premedicated before session;Patient requesting pain meds-RN notified;Ice applied     Hand Dominance     Extremity/Trunk Assessment Upper Extremity Assessment Upper Extremity Assessment: Overall WFL for tasks assessed           Communication Communication Communication: No difficulties   Cognition Arousal/Alertness: Awake/alert Behavior During Therapy: WFL for tasks assessed/performed Overall Cognitive Status: Within Functional Limits for tasks assessed                     General Comments       Exercises       Shoulder Instructions      Home Living Family/patient expects to be discharged to:: Private residence Living Arrangements: Spouse/significant other Available Help at Discharge: Family Type of Home: House Home Access: Stairs to enter Secretary/administratorntrance Stairs-Number of Steps: 3 Entrance Stairs-Rails: Can reach both Home Layout: One level     Bathroom Shower/Tub: Producer, television/film/videoWalk-in shower   Bathroom Toilet: Standard     Home Equipment: Shower seat;Bedside commode;Walker - 2 wheels          Prior Functioning/Environment Level of Independence: Independent  OT Diagnosis: Generalized weakness;Acute pain   OT Problem List: Decreased strength;Decreased activity tolerance;Pain   OT Treatment/Interventions: Self-care/ADL training;Therapeutic activities;Therapeutic exercise;DME and/or AE instruction;Patient/family education    OT Goals(Current goals can be found in the care plan section) Acute Rehab OT Goals Patient Stated Goal: To regaing independence OT Goal Formulation: With patient  OT Frequency: Min 1X/week   Barriers to D/C:            Co-evaluation              End of Session    Activity Tolerance: Patient tolerated treatment well Patient left: in chair;with call bell/phone within reach;with chair alarm  set   Time: 0935-1000 OT Time Calculation (min): 25 min Charges:  OT General Charges $OT Visit: 1 Procedure OT Evaluation $OT Eval Moderate Complexity: 1 Procedure OT Treatments $Self Care/Home Management : 8-22 mins G-Codes:    Olegario MessierElaine Titilayo Hagans, MS, OTR/L Olegario MessierElaine Alahna Dunne 09/26/2015, 10:30 AM

## 2015-09-26 NOTE — Progress Notes (Signed)
Pastoral Care offered.  °

## 2015-09-27 LAB — BASIC METABOLIC PANEL
ANION GAP: 8 (ref 5–15)
BUN: 13 mg/dL (ref 6–20)
CALCIUM: 8.4 mg/dL — AB (ref 8.9–10.3)
CO2: 28 mmol/L (ref 22–32)
CREATININE: 0.81 mg/dL (ref 0.44–1.00)
Chloride: 98 mmol/L — ABNORMAL LOW (ref 101–111)
GFR calc Af Amer: >60 mL/min (ref >60)
GLUCOSE: 141 mg/dL — AB (ref 65–99)
Potassium: 3.5 mmol/L (ref 3.5–5.1)
Sodium: 134 mmol/L — ABNORMAL LOW (ref 135–145)

## 2015-09-27 LAB — CBC
HEMATOCRIT: 34.6 % — AB (ref 35.0–47.0)
HEMOGLOBIN: 12.3 g/dL (ref 12.0–16.0)
MCH: 34.5 pg — ABNORMAL HIGH (ref 26.0–34.0)
MCHC: 35.5 g/dL (ref 32.0–36.0)
MCV: 97.2 fL (ref 80.0–100.0)
PLATELETS: 252 10*3/uL (ref 150–440)
RBC: 3.56 MIL/uL — AB (ref 3.80–5.20)
RDW: 13.3 % (ref 11.5–14.5)
WBC: 10.1 10*3/uL (ref 3.6–11.0)

## 2015-09-27 LAB — GLUCOSE, CAPILLARY
GLUCOSE-CAPILLARY: 164 mg/dL — AB (ref 65–99)
Glucose-Capillary: 128 mg/dL — ABNORMAL HIGH (ref 65–99)

## 2015-09-27 MED ORDER — LACTULOSE 10 GM/15ML PO SOLN: 10.0000 g | Freq: Two times a day (BID) | ORAL | Status: DC | PRN

## 2015-09-27 NOTE — Progress Notes (Signed)
Occupational Therapy Treatment Patient Details Name: Cindy Wolfe MRN: [id number] DOB: [birth year] Today's Date: 09/27/2015    History of present illness Cindy Wolfe is a 69yo white female who [facility]. Pt has previously had a Right TKA in 2016. 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    OT comments  Pt. Continues to present with pain, supervision for functional mobility, and tremors in UEs. Pt. Was provided with education about available A/E to assist with tremors during ADLs including self feeding, writing, and LE dressing. Pt. Plans to return home with her husband with an anticipated discharge today.   Follow Up Recommendations  No OT follow up    Equipment Recommendations       Recommendations for Other Services Rehab consult    Precautions / Restrictions Precautions Precautions: Knee;Fall Required Braces or Orthoses: Knee Immobilizer - Left Restrictions Weight Bearing Restrictions: Yes LLE Weight Bearing: Weight bearing as tolerated       Mobility Bed Mobility                  Transfers Overall transfer level: Needs assistance Equipment used: Rolling walker (2 wheeled) Transfers: Sit to/from Stand Sit to Stand: Supervision              Balance Overall balance assessment: Modified Independent                                 ADL                   Upper Body Dressing : Independent   Lower Body Dressing: Supervision/safety   Toilet Transfer: Supervision/safety             General ADL Comments: Pt. was provided education about adapted utensil and compensatory techniques for tremors. Pt. was provided with dycem, and a weight for writing. Pt. ed was provided about weighted utensils. Pt. was able to handle a weighted fork. Pt. was provided with a catalog showing various types of  A/E.      Vision                     Perception     Praxis      Cognition   Behavior During Therapy: WFL for tasks assessed/performed Overall Cognitive Status: Within Functional Limits for tasks assessed                       Extremity/Trunk Assessment               Exercises Total Joint Exercises Goniometric ROM: 83   Shoulder Instructions       General Comments      Pertinent Vitals/ Pain       Pain Assessment: 0-10 Pain Score: 6  Pain Location: Left Knee Pain Descriptors / Indicators: Aching Pain Intervention(s): Limited activity within patient's tolerance;Premedicated before session;Ice applied  Home Living                                          Prior Functioning/Environment              Frequency Min 1X/week     Progress Toward  Goals  OT Goals(current goals can now be found in the care plan section)        Plan Discharge plan remains appropriate    Co-evaluation                 End of Session     Activity Tolerance Patient tolerated treatment well   Patient Left in chair;with call bell/phone within reach;with chair alarm set   Nurse Communication          Time: [id number] OT Time Calculation (min): 38 min  Charges: OT General Charges $OT Visit: 1 Procedure OT Treatments $Self Care/Home Management : 38-52 mins  [name], MS, OTR/L  Olegario Messierlaine Terie Lear 09/27/2015, 12:13 PM

## 2015-09-27 NOTE — Care Management (Signed)
Patient for discharge today to home followed by [facility]. I have notified [facility] of patient discharge. No further RNCM needs. Case closed.

## 2015-09-27 NOTE — Progress Notes (Signed)
.     Subjective: 2 Days Post-Op Procedure(s) (LRB): COMPUTER ASSISTED TOTAL KNEE ARTHROPLASTY (Left) Patient reports pain as mild.   Patient is well, and has had no acute complaints or problems Continue with physical therapy today. Patient needs to do the lap around the nurse's desk, and do steps Plan is to go Home after hospital stay. no nausea and no vomiting.. Was noted to be nauseated yesterday afternoon doing therapy but no longer having any episodes. Patient denies any chest pains or shortness of breath. Objective: Vital signs in last 24 hours: Temp:  [97.6 F (36.4 C)-97.8 F (36.6 C)] 97.8 F (36.6 C) (06/23 0441) Pulse Rate:  [62-78] 65 (06/23 0441) Resp:  [16-20] 18 (06/23 0441) BP: (115-149)/(55-72) 115/58 mmHg (06/23 0441) SpO2:  [94 %-99 %] 94 % (06/23 0441) well approximated incision Heels are non tender and elevated off the bed using rolled towels Intake/Output from previous day: 06/22 0701 - 06/23 0700 In: 3804 [P.O.:480; I.V.:3324] Out: 2325 [Urine:1975; Drains:350] Intake/Output this shift:     Recent Labs  09/26/15 0644 09/27/15 0534  HGB 12.3 12.3    Recent Labs  09/26/15 0644 09/27/15 0534  WBC 12.3* 10.1  RBC 3.66* 3.56*  HCT 35.9 34.6*  PLT 248 252    Recent Labs  09/26/15 0644 09/27/15 0534  NA 136 134*  K 3.8 3.5  CL 100* 98*  CO2 28 28  BUN 14 13  CREATININE 0.78 0.81  GLUCOSE 169* 141*  CALCIUM 8.6* 8.4*   No results for input(s): LABPT, INR in the last 72 hours.  EXAM General - Patient is Alert, Appropriate and Oriented Extremity - Neurologically intact Neurovascular intact Sensation intact distally Intact pulses distally Dorsiflexion/Plantar flexion intact Compartment soft Dressing - dressing C/D/I Motor Function - intact, moving foot and toes well on exam.    Past Medical History  Diagnosis Date  . Complication of anesthesia     1 time I had a hard time waking up.  Marland Kitchen. PONV (postoperative nausea and vomiting)    . Family history of adverse reaction to anesthesia     daughter post op nausea and vomiting   . Diabetes mellitus without complication (HCC)   . Sleep apnea   . Vertigo   . Hypertension   . Hypothyroidism   . IBS (irritable bowel syndrome)   . Hemorrhoids   . Arthritis     Assessment/Plan: 2 Days Post-Op Procedure(s) (LRB): COMPUTER ASSISTED TOTAL KNEE ARTHROPLASTY (Left) Active Problems:   S/P total knee arthroplasty  There is no weight on file to calculate BMI. Up with therapy Discharge home with home health  Labs: reviewed DVT Prophylaxis - Lovenox, Foot Pumps and TED hose Weight-Bearing as tolerated to left leg Patient needs to have a bowel movement today. Patient will need to do the lap around the nurse's desk and do steps prior to being discharged this afternoon. If not able to do so will discharge tomorrow. Dressing will need to be changed prior to being discharged this afternoon.  Lynnda ShieldsJon R. Mobile [facility] D[facility] Surgery CenterWolfe PA [facility] 09/27/2015, 7:47 AM

## 2015-09-27 NOTE — Progress Notes (Signed)
Called floor to get patient weight to confirm enoxaparin dose is safe. Weight reported as 129 kg.  Cindi CarbonMary M Shyann Hefner, PharmD Clinical Pharmacist 09/27/2015 12:20 PM

## 2015-09-27 NOTE — Progress Notes (Signed)
Physical Therapy Treatment Patient Details Name: Cindy Wolfe MRN: [id number] DOB: [birth year] Today's Date: 09/27/2015    History of Present Illness Cindy Wolfe is a 69yo white female who [facility]. Pt has previously had a Right TKA in 2016. 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     PT Comments    Participated in exercises as described below.  She transitioned out of bed with ease.  Ambulated 120' with walker and supervision.  Pt reported knee hyperextension as she fatigued and ambulation limited by Clinical research associatewriter.  She returned to room and requested to use bedside commode for BM.  Assisted to commode and phone and call bell left with pt.  Pt stated she is comfortable with discharge home today.   Follow Up Recommendations  Home health PT     Equipment Recommendations  None recommended by PT    Recommendations for Other Services       Precautions / Restrictions Precautions Precautions: Knee;Fall Required Braces or Orthoses: Knee Immobilizer - Left Restrictions Weight Bearing Restrictions: Yes LLE Weight Bearing: Weight bearing as tolerated    Mobility  Bed Mobility Overal bed mobility: Modified Independent Bed Mobility: Supine to Sit              Transfers Overall transfer level: Needs assistance Equipment used: Rolling walker (2 wheeled) Transfers: Sit to/from Stand Sit to Stand: Supervision            Ambulation/Gait Ambulation/Gait assistance: Supervision Ambulation Distance (Feet): 80 Feet Assistive device: Rolling walker (2 wheeled) Gait Pattern/deviations: Step-through pattern   Gait velocity interpretation: <1.8 ft/sec, indicative of risk for recurrent falls     Stairs            Wheelchair Mobility    Modified Rankin (Stroke Patients Only)       Balance Overall balance assessment: Modified  Independent                                  Cognition Arousal/Alertness: Awake/alert Behavior During Therapy: WFL for tasks assessed/performed Overall Cognitive Status: Within Functional Limits for tasks assessed                      Exercises Total Joint Exercises Ankle Circles/Pumps: AROM;Both;20 reps;Supine Quad Sets: Left;10 reps;AROM;Supine Short Arc Quad: AROM;Strengthening;Left;10 reps;Supine Heel Slides: AROM;Left;10 reps;Supine Hip ABduction/ADduction: AROM;Left;10 reps;Supine Straight Leg Raises: AROM;10 reps;Supine;Left Goniometric ROM: 83    General Comments        Pertinent Vitals/Pain Pain Assessment: 0-10 Pain Score: 6  Pain Location: L knee Pain Descriptors / Indicators: Aching Pain Intervention(s): Limited activity within patient's tolerance    Home Living                      Prior Function            PT Goals (current goals can now be found in the care plan section) Progress towards PT goals: Progressing toward goals    Frequency  BID    PT Plan Current plan remains appropriate    Co-evaluation             End of Session Equipment Utilized During Treatment: Gait belt Activity Tolerance: Patient tolerated treatment well Patient left: in chair;with call bell/phone within reach  Time: [id number] PT Time Calculation (min) (ACUTE ONLY): 18 min  Charges:  $Gait Training: 8-22 mins $Therapeutic Exercise: 8-22 mins $Therapeutic Activity: 8-22 mins                    G Codes:      Danielle DessSarah Tiffini Blacksher 09/27/2015, 1:45 PM

## 2015-09-27 NOTE — Care Management Important Message (Signed)
Important Message  Patient Details  Name: Laqueta LindenKathryn E Sampedro MRN: [id number] Date of Birth: Nov 03, 1945   Medicare Important Message Given:  Yes    Olegario MessierKathy A Heli Dino 09/27/2015, 10:23 AM

## 2015-09-27 NOTE — Progress Notes (Signed)
Physical Therapy Treatment Patient Details Name: Laqueta LindenKathryn E Funderburk MRN: [id number] DOB: 05-04-1945 Today's Date: 09/27/2015    History of Present Illness Richrd SoxKathryn Scscepanski is a 70yo white female who [facility] L [facility]. Pt has previously had a Right TKA in 2016. 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     PT Comments    Pt up with CNA upon entering room.  Continued to ambulate out of room and at about 40' pt complained of feeling dizzy/fuzzy and needed to sit.  BP 113/46 P 70.  Pt returned to room and nurse notified of dizziness and request for pain meds. 8:44-9:03 Returned about 45 minutes later and she felt better and was able to participate fully. She was able to ambulate to PT gum, go up/down 4 stairs with B rails with ease and ambulate most of the way back to her room 140' before having some weakness in her knee.  No buckling noted but she felt unsure of it.  She had no further complaints of dizziness.  83 degrees knee flexion in sitting.  9:50-10:16   Will see pt again after lunch as she hopes to discharge this afternoon.    Follow Up Recommendations  Home health PT     Equipment Recommendations  None recommended by PT    Recommendations for Other Services       Precautions / Restrictions Precautions Precautions: Knee;Fall Restrictions Weight Bearing Restrictions: Yes    Mobility  Bed Mobility                  Transfers Overall transfer level: Needs assistance Equipment used: Rolling walker (2 wheeled) Transfers: Sit to/from Stand Sit to Stand: Supervision            Ambulation/Gait Ambulation/Gait assistance: Supervision Ambulation Distance (Feet): 30 Feet (+ 160 later this am) Assistive device: Rolling walker (2 wheeled) Gait Pattern/deviations: Step-through pattern   Gait velocity interpretation: <1.8 ft/sec, indicative  of risk for recurrent falls     Stairs            Wheelchair Mobility    Modified Rankin (Stroke Patients Only)       Balance Overall balance assessment: Modified Independent                                  Cognition Arousal/Alertness: Awake/alert Behavior During Therapy: WFL for tasks assessed/performed Overall Cognitive Status: Within Functional Limits for tasks assessed                      Exercises Total Joint Exercises Goniometric ROM: 83    General Comments        Pertinent Vitals/Pain Pain Assessment: 0-10 Pain Score: 6  Pain Location: L knee Pain Descriptors / Indicators: Aching Pain Intervention(s): Limited activity within patient's tolerance;Premedicated before session;Ice applied    Home Living                      Prior Function            PT Goals (current goals can now be found in the care plan section) Progress towards PT goals: Progressing toward goals    Frequency  BID    PT Plan Current plan remains appropriate    Co-evaluation  End of Session Equipment Utilized During Treatment: Gait belt Activity Tolerance: Patient tolerated treatment well Patient left: in chair;Other (comment) (OT in to continue care)     Time: [id number] PT Time Calculation (min) (ACUTE ONLY): 47 min  Charges:  $Gait Training: 38-52 mins $Therapeutic Activity: 8-22 mins                    G Codes:      Danielle DessSarah Annamay Laymon, PTA 09/27/2015, 11:04 AM

## 2015-09-27 NOTE — Progress Notes (Signed)
DISCHARGE NOTE:  Discharge instructions and prescriptions given to pt. Pt wheeled to car by staff.

## 2015-09-29 DIAGNOSIS — G4730 Sleep apnea, unspecified: Secondary | ICD-10-CM | POA: Diagnosis not present

## 2015-09-29 DIAGNOSIS — Z96652 Presence of left artificial knee joint: Secondary | ICD-10-CM | POA: Diagnosis not present

## 2015-09-29 DIAGNOSIS — Z79891 Long term (current) use of opiate analgesic: Secondary | ICD-10-CM | POA: Diagnosis not present

## 2015-09-29 DIAGNOSIS — Z471 Aftercare following joint replacement surgery: Secondary | ICD-10-CM | POA: Diagnosis not present

## 2015-09-29 DIAGNOSIS — M1991 Primary osteoarthritis, unspecified site: Secondary | ICD-10-CM | POA: Diagnosis not present

## 2015-09-29 DIAGNOSIS — I10 Essential (primary) hypertension: Secondary | ICD-10-CM | POA: Diagnosis not present

## 2015-09-29 DIAGNOSIS — Z7901 Long term (current) use of anticoagulants: Secondary | ICD-10-CM | POA: Diagnosis not present

## 2015-09-29 DIAGNOSIS — K589 Irritable bowel syndrome without diarrhea: Secondary | ICD-10-CM | POA: Diagnosis not present

## 2015-09-29 DIAGNOSIS — Z7984 Long term (current) use of oral hypoglycemic drugs: Secondary | ICD-10-CM | POA: Diagnosis not present

## 2015-09-29 DIAGNOSIS — E119 Type 2 diabetes mellitus without complications: Secondary | ICD-10-CM | POA: Diagnosis not present

## 2015-10-07 DIAGNOSIS — Z96652 Presence of left artificial knee joint: Secondary | ICD-10-CM | POA: Diagnosis not present

## 2015-10-07 DIAGNOSIS — Z471 Aftercare following joint replacement surgery: Secondary | ICD-10-CM | POA: Diagnosis not present

## 2015-10-07 DIAGNOSIS — G4730 Sleep apnea, unspecified: Secondary | ICD-10-CM | POA: Diagnosis not present

## 2015-10-07 DIAGNOSIS — K589 Irritable bowel syndrome without diarrhea: Secondary | ICD-10-CM | POA: Diagnosis not present

## 2015-10-07 DIAGNOSIS — E119 Type 2 diabetes mellitus without complications: Secondary | ICD-10-CM | POA: Diagnosis not present

## 2015-10-07 DIAGNOSIS — M1991 Primary osteoarthritis, unspecified site: Secondary | ICD-10-CM | POA: Diagnosis not present

## 2015-10-07 DIAGNOSIS — Z7901 Long term (current) use of anticoagulants: Secondary | ICD-10-CM | POA: Diagnosis not present

## 2015-10-07 DIAGNOSIS — I10 Essential (primary) hypertension: Secondary | ICD-10-CM | POA: Diagnosis not present

## 2015-10-07 DIAGNOSIS — Z7984 Long term (current) use of oral hypoglycemic drugs: Secondary | ICD-10-CM | POA: Diagnosis not present

## 2015-10-07 DIAGNOSIS — Z79891 Long term (current) use of opiate analgesic: Secondary | ICD-10-CM | POA: Diagnosis not present

## 2015-10-10 DIAGNOSIS — Z96652 Presence of left artificial knee joint: Secondary | ICD-10-CM | POA: Diagnosis not present

## 2015-10-14 DIAGNOSIS — Z96652 Presence of left artificial knee joint: Secondary | ICD-10-CM | POA: Diagnosis not present

## 2015-10-16 DIAGNOSIS — Z96652 Presence of left artificial knee joint: Secondary | ICD-10-CM | POA: Diagnosis not present

## 2015-10-22 DIAGNOSIS — Z96652 Presence of left artificial knee joint: Secondary | ICD-10-CM | POA: Diagnosis not present

## 2015-10-24 DIAGNOSIS — Z96652 Presence of left artificial knee joint: Secondary | ICD-10-CM | POA: Diagnosis not present

## 2015-10-30 DIAGNOSIS — Z96652 Presence of left artificial knee joint: Secondary | ICD-10-CM | POA: Diagnosis not present

## 2015-11-01 DIAGNOSIS — Z96652 Presence of left artificial knee joint: Secondary | ICD-10-CM | POA: Diagnosis not present

## 2015-11-06 DIAGNOSIS — Z471 Aftercare following joint replacement surgery: Secondary | ICD-10-CM | POA: Diagnosis not present

## 2015-11-06 DIAGNOSIS — Z96652 Presence of left artificial knee joint: Secondary | ICD-10-CM | POA: Diagnosis not present

## 2015-11-07 DIAGNOSIS — Z96652 Presence of left artificial knee joint: Secondary | ICD-10-CM | POA: Diagnosis not present

## 2016-02-17 DIAGNOSIS — E119 Type 2 diabetes mellitus without complications: Secondary | ICD-10-CM | POA: Diagnosis not present

## 2016-02-17 DIAGNOSIS — R7302 Impaired glucose tolerance (oral): Secondary | ICD-10-CM | POA: Diagnosis not present

## 2016-02-21 DIAGNOSIS — E785 Hyperlipidemia, unspecified: Secondary | ICD-10-CM | POA: Diagnosis not present

## 2016-02-21 DIAGNOSIS — I10 Essential (primary) hypertension: Secondary | ICD-10-CM | POA: Diagnosis not present

## 2016-02-21 DIAGNOSIS — E039 Hypothyroidism, unspecified: Secondary | ICD-10-CM | POA: Diagnosis not present

## 2016-02-21 DIAGNOSIS — E119 Type 2 diabetes mellitus without complications: Secondary | ICD-10-CM | POA: Diagnosis not present

## 2016-02-21 DIAGNOSIS — Z0000 Encounter for general adult medical examination without abnormal findings: Secondary | ICD-10-CM | POA: Diagnosis not present

## 2016-05-19 DIAGNOSIS — G4733 Obstructive sleep apnea (adult) (pediatric): Secondary | ICD-10-CM | POA: Diagnosis not present

## 2016-08-14 DIAGNOSIS — E119 Type 2 diabetes mellitus without complications: Secondary | ICD-10-CM | POA: Diagnosis not present

## 2016-08-14 DIAGNOSIS — E039 Hypothyroidism, unspecified: Secondary | ICD-10-CM | POA: Diagnosis not present

## 2016-08-14 DIAGNOSIS — Z1159 Encounter for screening for other viral diseases: Secondary | ICD-10-CM | POA: Diagnosis not present

## 2016-08-14 DIAGNOSIS — E785 Hyperlipidemia, unspecified: Secondary | ICD-10-CM | POA: Diagnosis not present

## 2016-08-14 DIAGNOSIS — I10 Essential (primary) hypertension: Secondary | ICD-10-CM | POA: Diagnosis not present

## 2016-08-19 DIAGNOSIS — E039 Hypothyroidism, unspecified: Secondary | ICD-10-CM | POA: Diagnosis not present

## 2016-08-19 DIAGNOSIS — E119 Type 2 diabetes mellitus without complications: Secondary | ICD-10-CM | POA: Diagnosis not present

## 2016-08-19 DIAGNOSIS — E785 Hyperlipidemia, unspecified: Secondary | ICD-10-CM | POA: Diagnosis not present

## 2016-08-19 DIAGNOSIS — R300 Dysuria: Secondary | ICD-10-CM | POA: Diagnosis not present

## 2016-08-19 DIAGNOSIS — I10 Essential (primary) hypertension: Secondary | ICD-10-CM | POA: Diagnosis not present

## 2016-10-19 DIAGNOSIS — E119 Type 2 diabetes mellitus without complications: Secondary | ICD-10-CM | POA: Diagnosis not present

## 2016-10-20 ENCOUNTER — Ambulatory Visit (INDEPENDENT_AMBULATORY_CARE_PROVIDER_SITE_OTHER): Payer: PPO | Admitting: Obstetrics and Gynecology

## 2016-10-20 ENCOUNTER — Encounter: Payer: Self-pay | Admitting: Obstetrics and Gynecology

## 2016-10-20 VITALS — BP 136/70 | HR 76 | Ht 63.0 in | Wt 288.0 lb

## 2016-10-20 DIAGNOSIS — Z1211 Encounter for screening for malignant neoplasm of colon: Secondary | ICD-10-CM

## 2016-10-20 DIAGNOSIS — Z1239 Encounter for other screening for malignant neoplasm of breast: Secondary | ICD-10-CM

## 2016-10-20 DIAGNOSIS — Z01419 Encounter for gynecological examination (general) (routine) without abnormal findings: Secondary | ICD-10-CM

## 2016-10-20 DIAGNOSIS — Z124 Encounter for screening for malignant neoplasm of cervix: Secondary | ICD-10-CM

## 2016-10-20 NOTE — Progress Notes (Addendum)
Chief Complaint  Patient presents with  . Gynecologic Exam    HPI:      Ms. Cindy Wolfe is a 71 y.o. [phone] who LMP was No LMP recorded. Patient is postmenopausal., presents today for her annual examination.  Her menses are absent and she is postmenopausal. She does not have intermenstrual bleeding.  She does not have vasomotor sx.  Sex activity: not sexually active. She does not have vaginal dryness.  Last Pap: October 02, 2013  Results were: no abnormalities  Hx of STDs: none  Last mammogram: November 13, 2013  Results were: normal--routine follow-up in 12 months There is no FH of breast cancer. There is no FH of ovarian cancer. The patient does not do self-breast exams.  Colonoscopy: colonoscopy 10 years ago without abnormalities. She is due for repeat scr colonoscopy due to age but doesn't have it sched yet. She last saw Dr. Marva PandaSkulskie.  DEXA: was screened for osteoporosis 2012 and was normal  Tobacco use: The patient denies current or previous tobacco use. Alcohol use: social drinker Exercise: not active  She does get adequate calcium and Vitamin D in her diet. She is followed by PCP.  Past Medical History:  Diagnosis Date  . Arthritis   . Complication of anesthesia    1 time I had a hard time waking up.  . Diabetes mellitus without complication (HCC)    TYPE 2  . Family history of adverse reaction to anesthesia    daughter post op nausea and vomiting   . GERD (gastroesophageal reflux disease)   . Hemorrhoids   . History of abnormal mammogram 09/28/2012   BIRADS 2 [facility]  . History of bone density study 2012   C PCP; WNL  . History of Papanicolaou smear of cervix 07/03/10; 10/02/13   -/-; -/-  . Hypercholesterolemia    CAN'T TAKE MEDS  . Hypertension   . Hypothyroidism   . IBS (irritable bowel syndrome)   . Migraine   . PONV (postoperative nausea and vomiting)   . Sleep apnea    USES CPAP  . Vertigo     Past Surgical History:  Procedure Laterality  Date  . CESAREAN SECTION  1971; 1977  . CHOLECYSTECTOMY  2004   DR. CERAME  . COLONOSCOPY  09/2013  . JOINT REPLACEMENT Right 05/14/2014, Left 2017  . KNEE ARTHROPLASTY Left 09/25/2015   Procedure: COMPUTER ASSISTED TOTAL KNEE ARTHROPLASTY;  Surgeon: Donato HeinzJames P Hooten, MD;  Location: [facility] ORS;  Service: Orthopedics;  Laterality: Left;  . LAPAROSCOPY    . REPEAT CESAREAN SECTION  1971 &1977   with BTL in 1977.    Family History  Problem Relation Age of Onset  . Rheum arthritis Mother   . Hypertension Mother   . Diabetes Father   . Hyperlipidemia Father   . Hypertension Father   . Diabetes Paternal Aunt   . Hypertension Paternal Aunt   . Osteoporosis Paternal Aunt     Social History   Social History  . Marital status: Married    Spouse name: N/A  . Number of children: N/A  . Years of education: N/A   Occupational History  . RETIRED    Social History Main Topics  . Smoking status: Former Smoker    Quit date: 09/12/1983  . Smokeless tobacco: Never Used  . Alcohol use 0.0 - 0.6 oz/week     Comment: 1-2 beers a month  . Drug use: No  . Sexual activity: No   Other  Topics Concern  . Not on file   Social History Narrative  . No narrative on file     Current Outpatient Prescriptions:  .  acetaminophen (TYLENOL) 500 MG tablet, Take 500-1,000 mg by mouth 2 (two) times daily. Take 2 tablets in the morning, and 1 tablet at dinner, Disp: , Rfl:  .  amLODipine (NORVASC) 5 MG tablet, Take 5 mg by mouth daily. Supper., Disp: , Rfl:  .  calcium carbonate (TUMS - DOSED IN MG ELEMENTAL CALCIUM) 500 MG chewable tablet, Chew 2 tablets by mouth as needed for indigestion or heartburn., Disp: , Rfl:  .  carbamide peroxide (GLY-OXIDE) 10 % solution, Place 1 application onto teeth as needed., Disp: , Rfl:  .  cholecalciferol (VITAMIN D) 1000 units tablet, Take 1,000 Units by mouth daily with supper. , Disp: , Rfl:  .  clotrimazole (LOTRIMIN) 1 % cream, Apply 1 application topically as needed.,  Disp: , Rfl:  .  clotrimazole-betamethasone (LOTRISONE) cream, Apply 1 application topically as needed (ears)., Disp: , Rfl:  .  enoxaparin (LOVENOX) 40 MG/0.4ML injection, Inject 0.4 mLs (40 mg total) into the skin daily., Disp: 14 Syringe, Rfl: 0 .  estradiol (ESTRACE) 0.1 MG/GM vaginal cream, Place 1 Applicatorful vaginally at bedtime., Disp: , Rfl:  .  ferrous sulfate 325 (65 FE) MG tablet, Take 325 mg by mouth daily with breakfast., Disp: , Rfl:  .  fexofenadine (ALLEGRA) 180 MG tablet, Take 180 mg by mouth as needed for allergies or rhinitis., Disp: , Rfl:  .  hydrocortisone cream 1 %, Apply 1 application topically as needed for itching., Disp: , Rfl:  .  hydroxypropyl methylcellulose / hypromellose (ISOPTO TEARS / GONIOVISC) 2.5 % ophthalmic solution, Place 1 drop into both eyes as needed for dry eyes., Disp: , Rfl:  .  levothyroxine (SYNTHROID, LEVOTHROID) 125 MCG tablet, Take 125 mcg by mouth daily before breakfast., Disp: , Rfl:  .  Loperamide HCl (IMODIUM A-D) 1 MG/7.5ML LIQD, Take 1 mg by mouth as needed., Disp: , Rfl:  .  losartan-hydrochlorothiazide (HYZAAR) 100-25 MG tablet, Take 1 tablet by mouth daily. Q am., Disp: , Rfl:  .  meclizine (ANTIVERT) 25 MG tablet, Take 25 mg by mouth as needed for dizziness., Disp: , Rfl:  .  metFORMIN (GLUCOPHAGE) 500 MG tablet, Take 500 mg by mouth 2 (two) times daily with a meal., Disp: , Rfl:  .  Multiple Vitamins-Iron (MULTIVITAMINS WITH IRON) TABS tablet, Take 1 tablet by mouth daily. At supper., Disp: , Rfl:  .  Multiple Vitamins-Minerals (CENTRUM SILVER PO), Take 1 capsule by mouth daily., Disp: , Rfl:  .  naproxen sodium (ALEVE) 220 MG tablet, Take 220 mg by mouth 2 (two) times daily with a meal., Disp: , Rfl:  .  nitrofurantoin (MACRODANTIN) 100 MG capsule, Take 100 mg by mouth 4 (four) times daily., Disp: , Rfl:  .  oxyCODONE (OXY IR/ROXICODONE) 5 MG immediate release tablet, Take 1-2 tablets (5-10 mg total) by mouth every 4 (four) hours as  needed for severe pain or breakthrough pain., Disp: 60 tablet, Rfl: 0 .  pioglitazone (ACTOS) 30 MG tablet, Take 30 mg by mouth daily. In am., Disp: , Rfl:  .  Probiotic Product (ALIGN) 4 MG CAPS, Take 4 mg by mouth daily with breakfast. , Disp: , Rfl:  .  propranolol (INDERAL) 40 MG tablet, Take 40 mg by mouth 2 (two) times daily., Disp: , Rfl:  .  psyllium (HYDROCIL/METAMUCIL) 95 % PACK, Take 1 packet by  mouth daily with lunch., Disp: , Rfl:  .  traMADol (ULTRAM) 50 MG tablet, Take 1-2 tablets (50-100 mg total) by mouth every 4 (four) hours as needed for moderate pain., Disp: 60 tablet, Rfl: 1 .  vitamin B-12 (CYANOCOBALAMIN) 500 MCG tablet, Take 500 mcg by mouth every other day. At supper., Disp: , Rfl:  .  white petrolatum (VASELINE) GEL, Apply 1 application topically as needed (opthalmic)., Disp: , Rfl:    ROS:  Review of Systems  Constitutional: Negative for fatigue, fever and unexpected weight change.  Respiratory: Negative for cough, shortness of breath and wheezing.   Cardiovascular: Negative for chest pain, palpitations and leg swelling.  Gastrointestinal: Negative for blood in stool, constipation, diarrhea, nausea and vomiting.  Endocrine: Negative for cold intolerance, heat intolerance and polyuria.  Genitourinary: Negative for dyspareunia, dysuria, flank pain, frequency, genital sores, hematuria, menstrual problem, pelvic pain, urgency, vaginal bleeding, vaginal discharge and vaginal pain.  Musculoskeletal: Negative for back pain, joint swelling and myalgias.  Skin: Negative for rash.  Neurological: Negative for dizziness, syncope, light-headedness, numbness and headaches.  Hematological: Negative for adenopathy.  Psychiatric/Behavioral: Negative for agitation, confusion, sleep disturbance and suicidal ideas. The patient is not nervous/anxious.      Objective: BP 136/70 (BP Location: Left Arm, Patient Position: Sitting, Cuff Size: Large)   Pulse 76   Ht 5\' 3"  (1.6 m)   Wt  288 lb (130.6 kg)   BMI 51.02 kg/m    Physical Exam  Constitutional: She is oriented to person, place, and time. She appears well-developed and well-nourished.  Genitourinary: Vagina normal and uterus normal. There is no rash or tenderness on the right labia. There is no rash or tenderness on the left labia. No erythema or tenderness in the vagina. No vaginal discharge found. Right adnexum does not display mass and does not display tenderness. Left adnexum does not display mass and does not display tenderness. Cervix does not exhibit motion tenderness or polyp. Uterus is not enlarged or tender.  Neck: Normal range of motion. No thyromegaly present.  Cardiovascular: Normal rate, regular rhythm and normal heart sounds.   No murmur heard. Pulmonary/Chest: Effort normal and breath sounds normal. Right breast exhibits no mass, no nipple discharge, no skin change and no tenderness. Left breast exhibits no mass, no nipple discharge, no skin change and no tenderness.  Abdominal: Soft. There is no tenderness. There is no guarding.  Musculoskeletal: Normal range of motion.  Neurological: She is alert and oriented to person, place, and time. No cranial nerve deficit.  Psychiatric: She has a normal mood and affect. Her behavior is normal.  Vitals reviewed.    Assessment/Plan:  Encounter for annual routine gynecological examination - Medicare breast/pap and pelvic  Cervical cancer screening - Plan: Pap IG (Image Guided)  Screening for breast cancer - Pt to sched mammo at [facility]. - Plan: MM DIGITAL SCREENING BILATERAL  Screening for colon cancer - REfer to GI, Dr. Marva Panda, for scr colonoscopy due to age. - Plan: Ambulatory referral to Gastroenterology         GYN counsel mammography screening, menopause, adequate intake of calcium and vitamin D     F/U  Return in about 2 years (around 10/21/2018) for annual.  Cindy Wolfe B. Vertie Dibbern, PA-C 10/20/2016 2:29 PM   ADDENDUM: Received letter from [facility]  that pt's last colonoscopy was 09/19/13 and was normal. Repeat due again in 10 yrs.

## 2016-10-21 ENCOUNTER — Other Ambulatory Visit: Payer: Self-pay | Admitting: Obstetrics and Gynecology

## 2016-10-21 DIAGNOSIS — Z1231 Encounter for screening mammogram for malignant neoplasm of breast: Secondary | ICD-10-CM

## 2016-10-23 LAB — PAP IG (IMAGE GUIDED): PAP Smear Comment: 0

## 2016-10-26 ENCOUNTER — Encounter: Payer: Self-pay | Admitting: Obstetrics and Gynecology

## 2016-11-09 ENCOUNTER — Ambulatory Visit
Admission: RE | Admit: 2016-11-09 | Discharge: 2016-11-09 | Disposition: A | Discharge status: Home / Self Care | Payer: PPO | Source: Ambulatory Visit | Attending: Obstetrics and Gynecology | Admitting: Obstetrics and Gynecology

## 2016-11-09 DIAGNOSIS — Z1231 Encounter for screening mammogram for malignant neoplasm of breast: Secondary | ICD-10-CM | POA: Insufficient documentation

## 2016-11-09 DIAGNOSIS — Z1239 Encounter for other screening for malignant neoplasm of breast: Secondary | ICD-10-CM

## 2016-11-10 ENCOUNTER — Encounter: Payer: Self-pay | Admitting: Obstetrics and Gynecology

## 2016-11-17 DIAGNOSIS — N390 Urinary tract infection, site not specified: Secondary | ICD-10-CM | POA: Diagnosis not present

## 2016-11-17 DIAGNOSIS — R300 Dysuria: Secondary | ICD-10-CM | POA: Diagnosis not present

## 2017-01-05 ENCOUNTER — Encounter: Payer: Self-pay | Admitting: Obstetrics and Gynecology

## 2017-02-04 DIAGNOSIS — Z96653 Presence of artificial knee joint, bilateral: Secondary | ICD-10-CM | POA: Diagnosis not present

## 2017-05-03 DIAGNOSIS — I10 Essential (primary) hypertension: Secondary | ICD-10-CM | POA: Diagnosis not present

## 2017-05-03 DIAGNOSIS — E039 Hypothyroidism, unspecified: Secondary | ICD-10-CM | POA: Diagnosis not present

## 2017-05-03 DIAGNOSIS — E119 Type 2 diabetes mellitus without complications: Secondary | ICD-10-CM | POA: Diagnosis not present

## 2017-08-18 DIAGNOSIS — G4733 Obstructive sleep apnea (adult) (pediatric): Secondary | ICD-10-CM | POA: Diagnosis not present

## 2017-12-22 ENCOUNTER — Other Ambulatory Visit: Payer: Self-pay | Admitting: Family Medicine

## 2017-12-22 DIAGNOSIS — Z1231 Encounter for screening mammogram for malignant neoplasm of breast: Secondary | ICD-10-CM

## 2017-12-23 DIAGNOSIS — E119 Type 2 diabetes mellitus without complications: Secondary | ICD-10-CM | POA: Diagnosis not present

## 2017-12-23 DIAGNOSIS — Z23 Encounter for immunization: Secondary | ICD-10-CM | POA: Diagnosis not present

## 2017-12-23 DIAGNOSIS — I10 Essential (primary) hypertension: Secondary | ICD-10-CM | POA: Diagnosis not present

## 2017-12-23 DIAGNOSIS — E785 Hyperlipidemia, unspecified: Secondary | ICD-10-CM | POA: Diagnosis not present

## 2017-12-23 DIAGNOSIS — E039 Hypothyroidism, unspecified: Secondary | ICD-10-CM | POA: Diagnosis not present

## 2017-12-23 DIAGNOSIS — K580 Irritable bowel syndrome with diarrhea: Secondary | ICD-10-CM | POA: Diagnosis not present

## 2017-12-23 DIAGNOSIS — Z0000 Encounter for general adult medical examination without abnormal findings: Secondary | ICD-10-CM | POA: Diagnosis not present

## 2017-12-24 DIAGNOSIS — E039 Hypothyroidism, unspecified: Secondary | ICD-10-CM | POA: Diagnosis not present

## 2017-12-24 DIAGNOSIS — E785 Hyperlipidemia, unspecified: Secondary | ICD-10-CM | POA: Diagnosis not present

## 2017-12-24 DIAGNOSIS — E119 Type 2 diabetes mellitus without complications: Secondary | ICD-10-CM | POA: Diagnosis not present

## 2017-12-24 DIAGNOSIS — I10 Essential (primary) hypertension: Secondary | ICD-10-CM | POA: Diagnosis not present

## 2017-12-29 ENCOUNTER — Ambulatory Visit
Admission: RE | Admit: 2017-12-29 | Discharge: 2017-12-29 | Disposition: A | Discharge status: Home / Self Care | Payer: PPO | Source: Ambulatory Visit | Attending: Family Medicine | Admitting: Family Medicine

## 2017-12-29 DIAGNOSIS — Z1231 Encounter for screening mammogram for malignant neoplasm of breast: Secondary | ICD-10-CM | POA: Diagnosis not present

## 2018-01-03 DIAGNOSIS — H6062 Unspecified chronic otitis externa, left ear: Secondary | ICD-10-CM | POA: Diagnosis not present

## 2018-01-03 DIAGNOSIS — H6122 Impacted cerumen, left ear: Secondary | ICD-10-CM | POA: Diagnosis not present

## 2018-01-27 DIAGNOSIS — Z01810 Encounter for preprocedural cardiovascular examination: Secondary | ICD-10-CM | POA: Diagnosis not present

## 2018-01-27 DIAGNOSIS — E119 Type 2 diabetes mellitus without complications: Secondary | ICD-10-CM | POA: Diagnosis not present

## 2018-01-27 DIAGNOSIS — I10 Essential (primary) hypertension: Secondary | ICD-10-CM | POA: Diagnosis not present

## 2018-01-27 DIAGNOSIS — I4510 Unspecified right bundle-branch block: Secondary | ICD-10-CM | POA: Diagnosis not present

## 2018-01-27 DIAGNOSIS — Z01818 Encounter for other preprocedural examination: Secondary | ICD-10-CM | POA: Diagnosis not present

## 2018-01-27 DIAGNOSIS — E785 Hyperlipidemia, unspecified: Secondary | ICD-10-CM | POA: Diagnosis not present

## 2018-02-08 DIAGNOSIS — Z96653 Presence of artificial knee joint, bilateral: Secondary | ICD-10-CM | POA: Diagnosis not present

## 2018-02-08 DIAGNOSIS — M25561 Pain in right knee: Secondary | ICD-10-CM | POA: Diagnosis not present

## 2018-02-08 DIAGNOSIS — M7050 Other bursitis of knee, unspecified knee: Secondary | ICD-10-CM | POA: Diagnosis not present

## 2018-02-08 DIAGNOSIS — Z6841 Body Mass Index (BMI) 40.0 and over, adult: Secondary | ICD-10-CM | POA: Diagnosis not present

## 2018-02-08 DIAGNOSIS — M1712 Unilateral primary osteoarthritis, left knee: Secondary | ICD-10-CM | POA: Diagnosis not present

## 2018-02-19 DIAGNOSIS — Z6841 Body Mass Index (BMI) 40.0 and over, adult: Secondary | ICD-10-CM | POA: Insufficient documentation

## 2018-04-28 DIAGNOSIS — G4733 Obstructive sleep apnea (adult) (pediatric): Secondary | ICD-10-CM | POA: Diagnosis not present

## 2018-06-27 DIAGNOSIS — N390 Urinary tract infection, site not specified: Secondary | ICD-10-CM | POA: Diagnosis not present

## 2019-01-23 DIAGNOSIS — G4733 Obstructive sleep apnea (adult) (pediatric): Secondary | ICD-10-CM | POA: Diagnosis not present

## 2019-02-22 DIAGNOSIS — G4733 Obstructive sleep apnea (adult) (pediatric): Secondary | ICD-10-CM | POA: Diagnosis not present

## 2019-03-24 DIAGNOSIS — G4733 Obstructive sleep apnea (adult) (pediatric): Secondary | ICD-10-CM | POA: Diagnosis not present

## 2019-07-17 DIAGNOSIS — Z0000 Encounter for general adult medical examination without abnormal findings: Secondary | ICD-10-CM | POA: Diagnosis not present

## 2019-07-17 DIAGNOSIS — E039 Hypothyroidism, unspecified: Secondary | ICD-10-CM | POA: Diagnosis not present

## 2019-07-17 DIAGNOSIS — M1990 Unspecified osteoarthritis, unspecified site: Secondary | ICD-10-CM | POA: Diagnosis not present

## 2019-07-17 DIAGNOSIS — E785 Hyperlipidemia, unspecified: Secondary | ICD-10-CM | POA: Diagnosis not present

## 2019-07-17 DIAGNOSIS — E119 Type 2 diabetes mellitus without complications: Secondary | ICD-10-CM | POA: Diagnosis not present

## 2019-07-17 DIAGNOSIS — Z6841 Body Mass Index (BMI) 40.0 and over, adult: Secondary | ICD-10-CM | POA: Diagnosis not present

## 2019-07-17 DIAGNOSIS — I10 Essential (primary) hypertension: Secondary | ICD-10-CM | POA: Diagnosis not present

## 2019-07-24 DIAGNOSIS — E119 Type 2 diabetes mellitus without complications: Secondary | ICD-10-CM | POA: Diagnosis not present

## 2019-07-24 DIAGNOSIS — I10 Essential (primary) hypertension: Secondary | ICD-10-CM | POA: Diagnosis not present

## 2019-07-24 DIAGNOSIS — E039 Hypothyroidism, unspecified: Secondary | ICD-10-CM | POA: Diagnosis not present

## 2019-07-24 DIAGNOSIS — E785 Hyperlipidemia, unspecified: Secondary | ICD-10-CM | POA: Diagnosis not present

## 2020-01-19 DIAGNOSIS — M1990 Unspecified osteoarthritis, unspecified site: Secondary | ICD-10-CM | POA: Diagnosis not present

## 2020-01-19 DIAGNOSIS — E785 Hyperlipidemia, unspecified: Secondary | ICD-10-CM | POA: Diagnosis not present

## 2020-01-19 DIAGNOSIS — E119 Type 2 diabetes mellitus without complications: Secondary | ICD-10-CM | POA: Diagnosis not present

## 2020-01-19 DIAGNOSIS — E039 Hypothyroidism, unspecified: Secondary | ICD-10-CM | POA: Diagnosis not present

## 2020-01-19 DIAGNOSIS — I10 Essential (primary) hypertension: Secondary | ICD-10-CM | POA: Diagnosis not present

## 2020-01-22 ENCOUNTER — Other Ambulatory Visit: Payer: Self-pay | Admitting: Family Medicine

## 2020-01-22 DIAGNOSIS — Z1231 Encounter for screening mammogram for malignant neoplasm of breast: Secondary | ICD-10-CM

## 2020-06-20 DIAGNOSIS — E119 Type 2 diabetes mellitus without complications: Secondary | ICD-10-CM | POA: Diagnosis not present

## 2020-08-09 DIAGNOSIS — K580 Irritable bowel syndrome with diarrhea: Secondary | ICD-10-CM | POA: Diagnosis not present

## 2020-08-09 DIAGNOSIS — E785 Hyperlipidemia, unspecified: Secondary | ICD-10-CM | POA: Diagnosis not present

## 2020-08-09 DIAGNOSIS — Z6841 Body Mass Index (BMI) 40.0 and over, adult: Secondary | ICD-10-CM | POA: Diagnosis not present

## 2020-08-09 DIAGNOSIS — E119 Type 2 diabetes mellitus without complications: Secondary | ICD-10-CM | POA: Diagnosis not present

## 2020-08-09 DIAGNOSIS — Z1331 Encounter for screening for depression: Secondary | ICD-10-CM | POA: Diagnosis not present

## 2020-08-09 DIAGNOSIS — I10 Essential (primary) hypertension: Secondary | ICD-10-CM | POA: Diagnosis not present

## 2020-08-09 DIAGNOSIS — Z0000 Encounter for general adult medical examination without abnormal findings: Secondary | ICD-10-CM | POA: Diagnosis not present

## 2020-08-09 DIAGNOSIS — E039 Hypothyroidism, unspecified: Secondary | ICD-10-CM | POA: Diagnosis not present

## 2020-08-13 DIAGNOSIS — E119 Type 2 diabetes mellitus without complications: Secondary | ICD-10-CM | POA: Diagnosis not present

## 2020-08-13 DIAGNOSIS — E039 Hypothyroidism, unspecified: Secondary | ICD-10-CM | POA: Diagnosis not present

## 2020-08-13 DIAGNOSIS — I10 Essential (primary) hypertension: Secondary | ICD-10-CM | POA: Diagnosis not present

## 2020-10-23 DIAGNOSIS — E039 Hypothyroidism, unspecified: Secondary | ICD-10-CM | POA: Diagnosis not present

## 2020-11-19 DIAGNOSIS — N390 Urinary tract infection, site not specified: Secondary | ICD-10-CM | POA: Diagnosis not present

## 2021-02-10 DIAGNOSIS — E785 Hyperlipidemia, unspecified: Secondary | ICD-10-CM | POA: Diagnosis not present

## 2021-02-10 DIAGNOSIS — E119 Type 2 diabetes mellitus without complications: Secondary | ICD-10-CM | POA: Diagnosis not present

## 2021-02-10 DIAGNOSIS — E039 Hypothyroidism, unspecified: Secondary | ICD-10-CM | POA: Diagnosis not present

## 2021-02-10 DIAGNOSIS — N1831 Chronic kidney disease, stage 3a: Secondary | ICD-10-CM | POA: Diagnosis not present

## 2021-02-10 DIAGNOSIS — I10 Essential (primary) hypertension: Secondary | ICD-10-CM | POA: Diagnosis not present

## 2021-02-11 DIAGNOSIS — I10 Essential (primary) hypertension: Secondary | ICD-10-CM | POA: Diagnosis not present

## 2021-02-11 DIAGNOSIS — E039 Hypothyroidism, unspecified: Secondary | ICD-10-CM | POA: Diagnosis not present

## 2021-02-11 DIAGNOSIS — E119 Type 2 diabetes mellitus without complications: Secondary | ICD-10-CM | POA: Diagnosis not present

## 2021-02-11 DIAGNOSIS — E785 Hyperlipidemia, unspecified: Secondary | ICD-10-CM | POA: Diagnosis not present

## 2021-05-15 DIAGNOSIS — H04322 Acute dacryocystitis of left lacrimal passage: Secondary | ICD-10-CM | POA: Diagnosis not present

## 2021-05-19 DIAGNOSIS — H04322 Acute dacryocystitis of left lacrimal passage: Secondary | ICD-10-CM | POA: Diagnosis not present

## 2021-06-09 DIAGNOSIS — H04322 Acute dacryocystitis of left lacrimal passage: Secondary | ICD-10-CM | POA: Diagnosis not present

## 2021-06-25 DIAGNOSIS — H04322 Acute dacryocystitis of left lacrimal passage: Secondary | ICD-10-CM | POA: Diagnosis not present

## 2021-08-25 DIAGNOSIS — Z6841 Body Mass Index (BMI) 40.0 and over, adult: Secondary | ICD-10-CM | POA: Diagnosis not present

## 2021-08-25 DIAGNOSIS — M8580 Other specified disorders of bone density and structure, unspecified site: Secondary | ICD-10-CM | POA: Diagnosis not present

## 2021-08-25 DIAGNOSIS — E119 Type 2 diabetes mellitus without complications: Secondary | ICD-10-CM | POA: Diagnosis not present

## 2021-08-25 DIAGNOSIS — E039 Hypothyroidism, unspecified: Secondary | ICD-10-CM | POA: Diagnosis not present

## 2021-08-25 DIAGNOSIS — Z0000 Encounter for general adult medical examination without abnormal findings: Secondary | ICD-10-CM | POA: Diagnosis not present

## 2021-08-25 DIAGNOSIS — I10 Essential (primary) hypertension: Secondary | ICD-10-CM | POA: Diagnosis not present

## 2021-08-25 DIAGNOSIS — K580 Irritable bowel syndrome with diarrhea: Secondary | ICD-10-CM | POA: Diagnosis not present

## 2021-09-18 DIAGNOSIS — M8588 Other specified disorders of bone density and structure, other site: Secondary | ICD-10-CM | POA: Diagnosis not present

## 2021-09-19 ENCOUNTER — Other Ambulatory Visit: Payer: Self-pay | Admitting: Family Medicine

## 2021-09-19 DIAGNOSIS — Z1231 Encounter for screening mammogram for malignant neoplasm of breast: Secondary | ICD-10-CM

## 2021-09-23 DIAGNOSIS — Z86010 Personal history of colonic polyps: Secondary | ICD-10-CM | POA: Diagnosis not present

## 2021-09-23 DIAGNOSIS — K580 Irritable bowel syndrome with diarrhea: Secondary | ICD-10-CM | POA: Diagnosis not present

## 2021-10-14 ENCOUNTER — Ambulatory Visit
Admission: RE | Admit: 2021-10-14 | Discharge: 2021-10-14 | Disposition: A | Discharge status: Home / Self Care | Payer: PPO | Source: Ambulatory Visit | Attending: Family Medicine | Admitting: Family Medicine

## 2021-10-14 DIAGNOSIS — Z1231 Encounter for screening mammogram for malignant neoplasm of breast: Secondary | ICD-10-CM | POA: Insufficient documentation

## 2021-12-23 ENCOUNTER — Ambulatory Visit: Admit: 2021-12-23 | Payer: HMO

## 2021-12-23 SURGERY — COLONOSCOPY WITH PROPOFOL
Anesthesia: General

## 2022-02-18 DIAGNOSIS — E119 Type 2 diabetes mellitus without complications: Secondary | ICD-10-CM | POA: Diagnosis not present

## 2022-02-18 DIAGNOSIS — E039 Hypothyroidism, unspecified: Secondary | ICD-10-CM | POA: Diagnosis not present

## 2022-02-18 DIAGNOSIS — I10 Essential (primary) hypertension: Secondary | ICD-10-CM | POA: Diagnosis not present

## 2022-03-24 DIAGNOSIS — Z6841 Body Mass Index (BMI) 40.0 and over, adult: Secondary | ICD-10-CM | POA: Diagnosis not present

## 2022-03-24 DIAGNOSIS — I10 Essential (primary) hypertension: Secondary | ICD-10-CM | POA: Diagnosis not present

## 2022-03-24 DIAGNOSIS — N1831 Chronic kidney disease, stage 3a: Secondary | ICD-10-CM | POA: Diagnosis not present

## 2022-03-24 DIAGNOSIS — E785 Hyperlipidemia, unspecified: Secondary | ICD-10-CM | POA: Diagnosis not present

## 2022-03-24 DIAGNOSIS — E119 Type 2 diabetes mellitus without complications: Secondary | ICD-10-CM | POA: Diagnosis not present

## 2022-03-24 DIAGNOSIS — E039 Hypothyroidism, unspecified: Secondary | ICD-10-CM | POA: Diagnosis not present

## 2022-05-26 DIAGNOSIS — E119 Type 2 diabetes mellitus without complications: Secondary | ICD-10-CM | POA: Diagnosis not present

## 2022-07-01 DIAGNOSIS — H35039 Hypertensive retinopathy, unspecified eye: Secondary | ICD-10-CM | POA: Diagnosis not present

## 2022-07-01 DIAGNOSIS — H43813 Vitreous degeneration, bilateral: Secondary | ICD-10-CM | POA: Diagnosis not present

## 2022-07-01 DIAGNOSIS — H2511 Age-related nuclear cataract, right eye: Secondary | ICD-10-CM | POA: Diagnosis not present

## 2022-07-01 DIAGNOSIS — H2512 Age-related nuclear cataract, left eye: Secondary | ICD-10-CM | POA: Diagnosis not present

## 2022-10-21 DIAGNOSIS — E119 Type 2 diabetes mellitus without complications: Secondary | ICD-10-CM | POA: Diagnosis not present

## 2022-10-21 DIAGNOSIS — K580 Irritable bowel syndrome with diarrhea: Secondary | ICD-10-CM | POA: Diagnosis not present

## 2022-10-21 DIAGNOSIS — E039 Hypothyroidism, unspecified: Secondary | ICD-10-CM | POA: Diagnosis not present

## 2022-10-21 DIAGNOSIS — N1831 Chronic kidney disease, stage 3a: Secondary | ICD-10-CM | POA: Diagnosis not present

## 2022-10-21 DIAGNOSIS — M8580 Other specified disorders of bone density and structure, unspecified site: Secondary | ICD-10-CM | POA: Diagnosis not present

## 2022-10-21 DIAGNOSIS — Z0000 Encounter for general adult medical examination without abnormal findings: Secondary | ICD-10-CM | POA: Diagnosis not present

## 2022-10-21 DIAGNOSIS — Z1331 Encounter for screening for depression: Secondary | ICD-10-CM | POA: Diagnosis not present

## 2022-10-21 DIAGNOSIS — E785 Hyperlipidemia, unspecified: Secondary | ICD-10-CM | POA: Diagnosis not present

## 2022-10-21 DIAGNOSIS — Z6841 Body Mass Index (BMI) 40.0 and over, adult: Secondary | ICD-10-CM | POA: Diagnosis not present

## 2022-10-21 DIAGNOSIS — I10 Essential (primary) hypertension: Secondary | ICD-10-CM | POA: Diagnosis not present

## 2022-10-21 DIAGNOSIS — R600 Localized edema: Secondary | ICD-10-CM | POA: Diagnosis not present

## 2022-12-21 DIAGNOSIS — G8929 Other chronic pain: Secondary | ICD-10-CM | POA: Diagnosis not present

## 2022-12-21 DIAGNOSIS — Z96652 Presence of left artificial knee joint: Secondary | ICD-10-CM | POA: Diagnosis not present

## 2022-12-21 DIAGNOSIS — M25562 Pain in left knee: Secondary | ICD-10-CM | POA: Diagnosis not present

## 2022-12-21 DIAGNOSIS — T84039A Mechanical loosening of unspecified internal prosthetic joint, initial encounter: Secondary | ICD-10-CM | POA: Diagnosis not present

## 2023-01-04 DIAGNOSIS — T84039A Mechanical loosening of unspecified internal prosthetic joint, initial encounter: Secondary | ICD-10-CM | POA: Diagnosis not present

## 2023-01-28 DIAGNOSIS — Z96651 Presence of right artificial knee joint: Secondary | ICD-10-CM | POA: Diagnosis not present

## 2023-01-28 DIAGNOSIS — M25462 Effusion, left knee: Secondary | ICD-10-CM | POA: Diagnosis not present

## 2023-01-28 DIAGNOSIS — G250 Essential tremor: Secondary | ICD-10-CM | POA: Insufficient documentation

## 2023-01-28 DIAGNOSIS — T84039A Mechanical loosening of unspecified internal prosthetic joint, initial encounter: Secondary | ICD-10-CM | POA: Diagnosis not present

## 2023-04-04 NOTE — Discharge Instructions (Signed)

## 2023-04-08 DIAGNOSIS — N3000 Acute cystitis without hematuria: Secondary | ICD-10-CM | POA: Diagnosis not present

## 2023-04-08 DIAGNOSIS — M25462 Effusion, left knee: Secondary | ICD-10-CM | POA: Diagnosis not present

## 2023-04-08 DIAGNOSIS — Z96652 Presence of left artificial knee joint: Secondary | ICD-10-CM | POA: Diagnosis not present

## 2023-04-08 DIAGNOSIS — Z6841 Body Mass Index (BMI) 40.0 and over, adult: Secondary | ICD-10-CM | POA: Diagnosis not present

## 2023-04-08 DIAGNOSIS — E119 Type 2 diabetes mellitus without complications: Secondary | ICD-10-CM | POA: Diagnosis not present

## 2023-04-08 DIAGNOSIS — M25562 Pain in left knee: Secondary | ICD-10-CM | POA: Diagnosis not present

## 2023-04-08 DIAGNOSIS — T84039A Mechanical loosening of unspecified internal prosthetic joint, initial encounter: Secondary | ICD-10-CM | POA: Diagnosis not present

## 2023-04-08 DIAGNOSIS — G8929 Other chronic pain: Secondary | ICD-10-CM | POA: Diagnosis not present

## 2023-04-09 ENCOUNTER — Encounter
Admission: RE | Admit: 2023-04-09 | Discharge: 2023-04-09 | Disposition: A | Discharge status: Home / Self Care | Payer: PPO | Source: Ambulatory Visit | Attending: Orthopedic Surgery | Admitting: Orthopedic Surgery

## 2023-04-09 ENCOUNTER — Other Ambulatory Visit: Payer: Self-pay

## 2023-04-09 DIAGNOSIS — I493 Ventricular premature depolarization: Secondary | ICD-10-CM | POA: Diagnosis not present

## 2023-04-09 DIAGNOSIS — Z01818 Encounter for other preprocedural examination: Secondary | ICD-10-CM | POA: Insufficient documentation

## 2023-04-09 DIAGNOSIS — X58XXXA Exposure to other specified factors, initial encounter: Secondary | ICD-10-CM | POA: Insufficient documentation

## 2023-04-09 DIAGNOSIS — E119 Type 2 diabetes mellitus without complications: Secondary | ICD-10-CM | POA: Insufficient documentation

## 2023-04-09 DIAGNOSIS — Z01812 Encounter for preprocedural laboratory examination: Secondary | ICD-10-CM

## 2023-04-09 DIAGNOSIS — R001 Bradycardia, unspecified: Secondary | ICD-10-CM | POA: Diagnosis not present

## 2023-04-09 DIAGNOSIS — Z96659 Presence of unspecified artificial knee joint: Secondary | ICD-10-CM | POA: Diagnosis not present

## 2023-04-09 DIAGNOSIS — T84038A Mechanical loosening of other internal prosthetic joint, initial encounter: Secondary | ICD-10-CM | POA: Diagnosis not present

## 2023-04-09 DIAGNOSIS — Z880 Allergy status to penicillin: Secondary | ICD-10-CM | POA: Diagnosis not present

## 2023-04-09 DIAGNOSIS — Z01810 Encounter for preprocedural cardiovascular examination: Secondary | ICD-10-CM

## 2023-04-09 HISTORY — DX: Essential (primary) hypertension: I10

## 2023-04-09 HISTORY — DX: Diverticulosis of intestine, part unspecified, without perforation or abscess without bleeding: K57.90

## 2023-04-09 HISTORY — DX: Hyperlipidemia, unspecified: E78.5

## 2023-04-09 HISTORY — DX: Unspecified right bundle-branch block: I45.10

## 2023-04-09 HISTORY — DX: Other specified disorders of bone density and structure, unspecified site: M85.80

## 2023-04-09 HISTORY — DX: Anemia, unspecified: D64.9

## 2023-04-09 HISTORY — DX: Type 2 diabetes mellitus without complications: E11.9

## 2023-04-09 HISTORY — DX: Unspecified osteoarthritis, unspecified site: M19.90

## 2023-04-09 HISTORY — DX: Chronic kidney disease, stage 3a: N18.31

## 2023-04-09 HISTORY — DX: Deficiency of other specified B group vitamins: E53.8

## 2023-04-09 LAB — CBC
HCT: 40.2 % (ref 36.0–46.0)
Hemoglobin: 13.6 g/dL (ref 12.0–15.0)
MCH: 33.1 pg (ref 26.0–34.0)
MCHC: 33.8 g/dL (ref 30.0–36.0)
MCV: 97.8 fL (ref 80.0–100.0)
Platelets: 329 10*3/uL (ref 150–400)
RBC: 4.11 MIL/uL (ref 3.87–5.11)
RDW: 12.8 % (ref 11.5–15.5)
WBC: 8.6 10*3/uL (ref 4.0–10.5)
nRBC: 0 % (ref 0.0–0.2)

## 2023-04-09 LAB — C-REACTIVE PROTEIN: CRP: 0.6 mg/dL (ref <1.0)

## 2023-04-09 LAB — COMPREHENSIVE METABOLIC PANEL
ALT: 17 U/L (ref 0–44)
AST: 23 U/L (ref 15–41)
Albumin: 3.9 g/dL (ref 3.5–5.0)
Alkaline Phosphatase: 54 U/L (ref 38–126)
Anion gap: 14 (ref 5–15)
BUN: 20 mg/dL (ref 8–23)
CO2: 28 mmol/L (ref 22–32)
Calcium: 10.1 mg/dL (ref 8.9–10.3)
Chloride: 94 mmol/L — ABNORMAL LOW (ref 98–111)
Creatinine, Ser: 0.94 mg/dL (ref 0.44–1.00)
GFR, Estimated: >60 mL/min (ref >60)
Glucose, Bld: 98 mg/dL (ref 70–99)
Potassium: 4 mmol/L (ref 3.5–5.1)
Sodium: 136 mmol/L (ref 135–145)
Total Bilirubin: 0.6 mg/dL (ref 0.0–1.2)
Total Protein: 8.1 g/dL (ref 6.5–8.1)

## 2023-04-09 LAB — TYPE AND SCREEN
ABO/RH(D): A POS
Antibody Screen: NEGATIVE

## 2023-04-09 LAB — SEDIMENTATION RATE: Sed Rate: 23 mm/h (ref 0–30)

## 2023-04-09 LAB — SURGICAL PCR SCREEN
MRSA, PCR: NEGATIVE
Staphylococcus aureus: NEGATIVE

## 2023-04-09 NOTE — Patient Instructions (Addendum)
 Your procedure is scheduled on: Wednesday, January 8 Report to the Registration Desk on the 1st floor of the [facility] To find out your arrival time, please call [phone] between 1PM - 3PM on: Tuesday, January 7 If your arrival time is 6:00 am, do not arrive before that time as the [facility] entrance doors do not open until 6:00 am.  REMEMBER: Instructions that are not followed completely may result in serious medical risk, up to and including death; or upon the discretion of your surgeon and anesthesiologist your surgery may need to be rescheduled.  Do not eat food after midnight the night before surgery.  No gum chewing or hard candies.  You may however, drink water up to 2 hours before you are scheduled to arrive for your surgery. Do not drink anything within 2 hours of your scheduled arrival time.  In addition, your doctor has ordered for you to drink the provided:  Gatorade G2 Drinking this carbohydrate drink up to two hours before surgery helps to reduce insulin  resistance and improve patient outcomes. Please complete drinking 2 hours before scheduled arrival time.  One week prior to surgery: starting today, January 3 Stop Anti-inflammatories (NSAIDS) such as Advil, Aleve, Ibuprofen, Motrin, Naproxen, Naprosyn and Aspirin  based products such as Excedrin, Goody's Powder, BC Powder. Stop ANY OVER THE COUNTER supplements until after surgery. Stop Vitamins.  You may however, continue to take Tylenol  if needed for pain up until the day of surgery.  Metformin  - hold 2 days before surgery. Last day to take Metformin  is Sunday, January 5. Resume AFTER surgery.  Continue taking all of your other prescription medications up until the day of surgery.  ON THE DAY OF SURGERY ONLY TAKE THESE MEDICATIONS WITH SIPS OF WATER:  Amlodipine  Levothyroxine  Propranolol  Macrobid  No Alcohol  for 24 hours before or after surgery.  No Smoking including e-cigarettes for 24 hours before  surgery.  No chewable tobacco products for at least 6 hours before surgery.  No nicotine patches on the day of surgery.  Do not use any recreational drugs for at least a week (preferably 2 weeks) before your surgery.  Please be advised that the combination of cocaine and anesthesia may have negative outcomes, up to and including death. If you test positive for cocaine, your surgery will be cancelled.  On the morning of surgery brush your teeth with toothpaste and water, you may rinse your mouth with mouthwash if you wish. Do not swallow any toothpaste or mouthwash.  Use CHG Soap as directed on instruction sheet.  Do not wear jewelry, make-up, hairpins, clips or nail polish.  For welded (permanent) jewelry: bracelets, anklets, waist bands, etc.  Please have this removed prior to surgery.  If it is not removed, there is a chance that hospital personnel will need to cut it off on the day of surgery.  Do not wear lotions, powders, or perfumes.   Do not shave body hair from the neck down 48 hours before surgery.  Contact lenses, hearing aids and dentures may not be worn into surgery.  Do not bring valuables to the hospital. [facility] is not responsible for any missing/lost belongings or valuables.   Notify your doctor if there is any change in your medical condition (cold, fever, infection).  Wear comfortable clothing (specific to your surgery type) to the hospital.  After surgery, you can help prevent lung complications by doing breathing exercises.  Take deep breaths and cough every 1-2 hours. Your doctor may order  a device called an Incentive Spirometer to help you take deep breaths.  If you are being admitted to the hospital overnight, leave your suitcase in the car. After surgery it may be brought to your room.  In case of increased patient census, it may be necessary for you, the patient, to continue your postoperative care in the Same Day Surgery department.  If you are  being discharged the day of surgery, you will not be allowed to drive home. You will need a responsible individual to drive you home and stay with you for 24 hours after surgery.   If you are taking public transportation, you will need to have a responsible individual with you.  Please call the Pre-admissions Testing Dept. at [phone] if you have any questions about these instructions.  Surgery Visitation Policy:  Patients having surgery or a procedure may have two visitors.  Children under the age of 43 must have an adult with them who is not the patient.  Inpatient Visitation:    Visiting hours are 7 a.m. to 8 p.m. Up to four visitors are allowed at one time in a patient room. The visitors may rotate out with other people during the day.  One visitor age 79 or older may stay with the patient overnight and must be in the room by 8 p.m.    Pre-operative 5 CHG Bath Instructions   You can play a key role in reducing the risk of infection after surgery. Your skin needs to be as free of germs as possible. You can reduce the number of germs on your skin by washing with CHG (chlorhexidine  gluconate) soap before surgery. CHG is an antiseptic soap that kills germs and continues to kill germs even after washing.   DO NOT use if you have an allergy to chlorhexidine /CHG or antibacterial soaps. If your skin becomes reddened or irritated, stop using the CHG and notify one of our RNs at [phone].   Please shower with the CHG soap starting 4 days before surgery using the following schedule:     Please keep in mind the following:  DO NOT shave, including legs and underarms, starting the day of your first shower.   You may shave your face at any point before/day of surgery.  Place clean sheets on your bed the day you start using CHG soap. Use a clean washcloth (not used since being washed) for each shower. DO NOT sleep with pets once you start using the CHG.   CHG Shower Instructions:   If you choose to wash your hair and private area, wash first with your normal shampoo/soap.  After you use shampoo/soap, rinse your hair and body thoroughly to remove shampoo/soap residue.  Turn the water OFF and apply about 3 tablespoons (45 ml) of CHG soap to a CLEAN washcloth.  Apply CHG soap ONLY FROM YOUR NECK DOWN TO YOUR TOES (washing for 3-5 minutes)  DO NOT use CHG soap on face, private areas, open wounds, or sores.  Pay special attention to the area where your surgery is being performed.  If you are having back surgery, having someone wash your back for you may be helpful. Wait 2 minutes after CHG soap is applied, then you may rinse off the CHG soap.  Pat dry with a clean towel  Put on clean clothes/pajamas   If you choose to wear lotion, please use ONLY the CHG-compatible lotions on the back of this paper.     Additional instructions for the day  of surgery: DO NOT APPLY any lotions, deodorants, cologne, or perfumes.   Put on clean/comfortable clothes.  Brush your teeth.  Ask your nurse before applying any prescription medications to the skin.      CHG Compatible Lotions   Aveeno Moisturizing lotion  Cetaphil Moisturizing Cream  Cetaphil Moisturizing Lotion  Clairol Herbal Essence Moisturizing Lotion, Dry Skin  Clairol Herbal Essence Moisturizing Lotion, Extra Dry Skin  Clairol Herbal Essence Moisturizing Lotion, Normal Skin  Curel Age Defying Therapeutic Moisturizing Lotion with Alpha Hydroxy  Curel Extreme Care Body Lotion  Curel Soothing Hands Moisturizing Hand Lotion  Curel Therapeutic Moisturizing Cream, Fragrance-Free  Curel Therapeutic Moisturizing Lotion, Fragrance-Free  Curel Therapeutic Moisturizing Lotion, Original Formula  Eucerin Daily Replenishing Lotion  Eucerin Dry Skin Therapy Plus Alpha Hydroxy Crme  Eucerin Dry Skin Therapy Plus Alpha Hydroxy Lotion  Eucerin Original Crme  Eucerin Original Lotion  Eucerin Plus Crme Eucerin Plus Lotion   Eucerin TriLipid Replenishing Lotion  Keri Anti-Bacterial Hand Lotion  Keri Deep Conditioning Original Lotion Dry Skin Formula Softly Scented  Keri Deep Conditioning Original Lotion, Fragrance Free Sensitive Skin Formula  Keri Lotion Fast Absorbing Fragrance Free Sensitive Skin Formula  Keri Lotion Fast Absorbing Softly Scented Dry Skin Formula  Keri Original Lotion  Keri Skin Renewal Lotion Keri Silky Smooth Lotion  Keri Silky Smooth Sensitive Skin Lotion  Nivea Body Creamy Conditioning Oil  Nivea Body Extra Enriched Lotion  Nivea Body Original Lotion  Nivea Body Sheer Moisturizing Lotion Nivea Crme  Nivea Skin Firming Lotion  NutraDerm 30 Skin Lotion  NutraDerm Skin Lotion  NutraDerm Therapeutic Skin Cream  NutraDerm Therapeutic Skin Lotion  ProShield Protective Hand Cream  Provon moisturizing lotion  Preoperative Educational Videos for Total Hip, Knee and Shoulder Replacements  To better prepare for surgery, please view our videos that explain the physical activity and discharge planning required to have the best surgical recovery at [facility].  [email or web address]  Questions? Call [phone] or email [email or web address]

## 2023-04-09 NOTE — Pre-Procedure Instructions (Signed)
 Spoke with nurse who interviewed pt-No changes in her cardiac status and has remained stable

## 2023-04-10 LAB — HEMOGLOBIN A1C
Hgb A1c MFr Bld: 6.5 % — ABNORMAL HIGH (ref 4.8–5.6)
Mean Plasma Glucose: 140 mg/dL

## 2023-04-11 ENCOUNTER — Encounter: Payer: Self-pay | Admitting: Orthopedic Surgery

## 2023-04-11 NOTE — H&P (Signed)
 ORTHOPAEDIC HISTORY & PHYSICAL Drake Fonda L[place], [place] - 04/08/2023 2:15 PM EST Formatting of this note is different from the original. NAME: Cindy Wolfe H&P Date: 04/10/2023 Procedure Date: 04/14/2023  Chief Complaint: left knee pain and giving way  HPI Cindy Wolfe is a 78 y.o. female who has had an increase in left knee discomfort over the last year, that has greatly increased over the past 4 months. Patient does have a history of bilateral knee replacements, with her left knee being performed by Dr. Mardee in 2017. She was seen by Dr. Mardee in October, 2024 for this issue, where she stated that she was having increased discomfort with crossing her leg and any weightbearing activity. The pain was localized along the medial aspect of the knee. She denies any swelling, locking, but does report some giving way of the knee. She noted an increased inability to ambulate long distances, and has been using a walker for assistance. She was found to have negative synovial cultures from the aspiration, and x-rays showed potential loosening of tibial components and mild medial tibial plateau invasion. She denies having any falls or trauma to this area. She has requested operative revision based intervention for relief of her DJD symptoms. She denies having any respiratory issues, states that she has a previously known about, chronic right bundle branch block. She has no known history of DVTs or clots. She is a diabetic, her last hemoglobin A1c was 6.4.  Of note, the patient states that she has noticed some increased urinary frequency and burning. States that she thinks that she has a UTI.  Social Hx: Patient is retired. She lives at home with her husband, and states that she has a daughter that lives in [place]. She denies any illicit drug use, denies any nicotine or smoking currently. States that she has a history of smoking a long time ago, over 40 years. States that she will have 1-2  standard alcoholic drinks per month.  Medications & Allergies Allergies: Allergies Allergen Reactions Lidocaine  Other (See Comments) IV FORM ONLY- Hypotension Adhes. Band-Tape-Benzalkonium Rash INCLUDING STERI-STRIPS Codeine Nausea Lescol [Fluvastatin] Muscle Pain Lipitor [Atorvastatin] Muscle Pain Neomycin -Bacitracnzn-Polymyxnb Itching and Rash Other Rash INCLUDING STERI-STRIPS; SEASONAL ALLERGIES Penicillins Rash Statins-Hmg-Coa Reductase Inhibitors Muscle Pain Sulfa (Sulfonamide Antibiotics) Rash Tramadol  Nausea  Home Medicines: Current Outpatient Medications on File Prior to Visit Medication Sig Dispense Refill acetaminophen  (TYLENOL ) 500 mg capsule Take 1,000 mg by mouth every 8 (eight) hours as needed artificial tears,hypromellose, 0.3 % Drop Apply 2 drops to eye nightly ascorbic acid, vitamin C, (VITAMIN C) 500 MG tablet Take 500 mg by mouth once daily benzocaine/menthol  (CEPACOL SORE THROAT, BENZ-MEN, MM) Take 1 lozenge by mouth continuously as needed calcium  carbonate (TUMS E-X) 300 mg (750 mg) chewable tablet Take 300 mg of elemental by mouth every 2 (two) hours as needed for Heartburn. calcium  carbonate-vitamin D3 (CALTRATE 600+D) 600 mg-10 mcg (400 unit) tablet Take 1 tablet by mouth once daily cholecalciferol  (VITAMIN D3) 1000 unit capsule Take 1,000 Units by mouth once daily clotrimazole -betamethasone (LOTRISONE) 1-0.05 % cream Apply topically 2 (two) times daily 45 g 3 cyanocobalamin  (VITAMIN B12) 500 MCG tablet Take 500 mcg by mouth every other day. diphenhydrAMINE  (BENADRYL ) 25 mg capsule Take 25 mg by mouth every 6 (six) hours as needed for Itching lactobacillus rhamnosus, GG, (CULTURELLE) 10 billion cell capsule Take 1 capsule by mouth once daily levothyroxine  (SYNTHROID ) 150 MCG tablet Take 1 tablet (150 mcg total) by mouth once daily Take on an empty  stomach with a glass of water at least 30-60 minutes before breakfast. 90 tablet 1 loperamide  (IMODIUM ) 1  mg/7.5 mL solution Take 7.5 mLs by mouth once daily as needed. loratadine  (CLARITIN ) 10 mg tablet Take 10 mg by mouth once daily meclizine  (ANTIVERT ) 32 MG tablet Take 32 mg by mouth 3 (three) times daily as needed. multivitamin capsule Take 1 capsule by mouth once daily. ONETOUCH ULTRA CONTROL Soln USE AS DIRECTED 1 each 0 ONETOUCH ULTRA TEST test strip use 1 strip to check glucose three times daily 200 each 0 pioglitazone  (ACTOS ) 30 MG tablet Take 1 tablet by mouth once daily 90 tablet 3 propranoloL  (INDERAL ) 40 MG tablet Take 2 tablets by mouth twice daily 360 tablet 1 simethicone (MYLICON) 125 MG chewable tablet Take 125 mg by mouth every 6 (six) hours as needed telmisartan -hydroCHLOROthiazide  (MICARDIS  HCT) 80-25 mg tablet Take 1 tablet by mouth once daily 90 tablet 0  No current facility-administered medications on file prior to visit.  Medical / Surgical History  Past Medical History: Diagnosis Date Anemia, unspecified Diverticulosis DJD (degenerative joint disease) Essential hypertension, benign Essential tremor benign GERD (gastroesophageal reflux disease) History of migraine IBS (irritable bowel syndrome) 2009 Obesity Osteopenia Peripheral neuropathy Pure hypercholesterolemia Sleep apnea Tremor of both hands 1998-1999 Type II or unspecified type diabetes mellitus without mention of complication, not stated as uncontrolled (CMS/HHS-HCC) Unspecified hypothyroidism   Past Surgical History: Procedure Laterality Date COLONOSCOPY 06/10/2000 Dr. GORMAN. [place] @ [facility] - Int. Hemorrh., Diverticulosis EGD 07/18/2005 EGD 09/19/2013 No repeat needed per MUS Right total knee arthroplasty using computer-assisted navigation 05/14/14 Left total knee arthroplasty using computer-assisted navigation 09/25/2015 Dr Mardee BILATERAL TUBAL LIGATION C-SECTIONS X2 CHOLECYSTECTOMY COLONOSCOPY 09/19/2013, 07/21/2005 Dr. EMERSON Mariner @ [facility] - Int. Hemorrh., Diverticulosis, rpt10 yrs per  MUS TUBAL LIGATION   Physical Exam  Ht:160 cm (5' 3) Wt:(!) 126.1 kg (278 lb) BMI: Body mass index is 49.25 kg/m.  General/Constitutional: No apparent distress: well-nourished and well developed. Eyes: Pupils equal, round with synchronous movement. Lymphatic: No palpable adenopathy. Respiratory: Patient has good chest rise and fall with inspiration and expiration. All lung fields are clear to auscultation bilaterally. There is no Rales, rhonchi or wheezes appreciated. Cardiovascular: Upon auscultation there is a regular rate and rhythm without any murmurs, rubs, gallops or heaves appreciated. There does not appear to be any swelling down the lower extremities. Posterior tibial pulses appreciated bilaterally, 2+. Integumentary: No impressive skin lesions present, except as noted in detailed exam. Neuro/Psych: Normal mood and affect, oriented to person, place and time. Musculoskeletal: see exam below  Left knee exam Upon inspection of the patient's left knee there does not appear to be any skin changes, open abrasions, swelling or redness. There is a midline incision consistent with patient's history of left TKA. There is a relative varus alignment. Upon palpation, the patient reports having pain along the medial aspect of their knee. Patient has full extension actively with ROM, and able to flex back to 116 degrees with mild pain. Varus and valgus stress testing shows pseudolaxity to valgus stress testing. The patella tracks well within the femoral groove from flexion into extension, without any evidence of tilt or subluxation. Anterior and posterior drawer testing was not tested. Patient noted to have fair quadricep strength with some noted muscular atrophy. Patient is neurovascularly intact down their lower extremity to all dermatomes. Posterior tibial pulses appreciated 2+.  Imaging left Knee Imaging: A series of x-rays were ordered and interpreted of the patient's left knee. These images  included AP, lateral and sunrise views. Upon inspection of AP view, there appears to be some medial sided tibial component collapse pushing the knee into slightly varus alignment. There is a radiolucent line appreciated in between the slightly collapsed medial tibial plateau and tibial implant. Femoral implant appears to be well-fixed upon AP and lateral viewing. There does not appear to be any periprosthetic fracture or evidence of polyethylene wear/osteolysis. When compared with her previous films taken in October 2024, there is not appear to be any worsening of tibial plateau collapse noted. No fractures, lytic lesions or gross deformities appreciated on films.  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 Consents has been signed. The risks, benefits, prognosis and alternatives including but not limited to DVT, PE, infection, neurovascular injury, failure of the procedure and death were explained to the patient and she is willing to proceed with surgery as described to her by myself. Plan will be for post operative admission of at least 1 midnight for pain control and PT. She will be managed with DVT prophylaxis, antibiotics preoperatively for 24 hours and aggressive in patient rehab.  Pre, intra and post op interventions were discussed. Patient has good understanding  Medication Reconciliation was performed. Discussed cessation of metformin , pioglitazone , vitamins and supplements.  A urinalysis with culture was ordered. Found initially to have increased nitrites, white cell count and found to have bacteria present. Patient was sent in Macrobid twice daily for 5 days.  A total of 50 minutes was spent reviewing patient's charts, medical reconciliation, discussing/educating the  patient about surgical interventions, and answering any questions provided by the patient.  JOSHUA DALLAS KOYANAGI, PA [facility] 04/08/2023  Electronically signed by Koyanagi Fonda Dallas, PA at 04/10/2023 1:55 PM EST

## 2023-04-12 LAB — IGE: IgE (Immunoglobulin E), Serum: 429 [IU]/mL (ref 6–495)

## 2023-04-13 ENCOUNTER — Encounter: Payer: Self-pay | Admitting: Orthopedic Surgery

## 2023-04-14 ENCOUNTER — Other Ambulatory Visit: Payer: Self-pay

## 2023-04-14 ENCOUNTER — Encounter
Admission: RE | Disposition: A | Discharge status: Home Health | Payer: Self-pay | Source: Home / Self Care | Attending: Orthopedic Surgery

## 2023-04-14 ENCOUNTER — Inpatient Hospital Stay: Payer: PPO | Admitting: Certified Registered"

## 2023-04-14 ENCOUNTER — Inpatient Hospital Stay
Admission: RE | Admit: 2023-04-14 | Discharge: 2023-04-15 | DRG: 467 | Disposition: A | Discharge status: Home Health | Payer: PPO | Attending: Orthopedic Surgery | Admitting: Orthopedic Surgery

## 2023-04-14 ENCOUNTER — Encounter: Payer: Self-pay | Admitting: Orthopedic Surgery

## 2023-04-14 ENCOUNTER — Inpatient Hospital Stay: Payer: PPO

## 2023-04-14 DIAGNOSIS — Z882 Allergy status to sulfonamides status: Secondary | ICD-10-CM | POA: Diagnosis not present

## 2023-04-14 DIAGNOSIS — I129 Hypertensive chronic kidney disease with stage 1 through stage 4 chronic kidney disease, or unspecified chronic kidney disease: Secondary | ICD-10-CM | POA: Diagnosis present

## 2023-04-14 DIAGNOSIS — Z881 Allergy status to other antibiotic agents status: Secondary | ICD-10-CM | POA: Diagnosis not present

## 2023-04-14 DIAGNOSIS — Z880 Allergy status to penicillin: Secondary | ICD-10-CM

## 2023-04-14 DIAGNOSIS — Z79899 Other long term (current) drug therapy: Secondary | ICD-10-CM | POA: Diagnosis not present

## 2023-04-14 DIAGNOSIS — E1142 Type 2 diabetes mellitus with diabetic polyneuropathy: Secondary | ICD-10-CM | POA: Diagnosis not present

## 2023-04-14 DIAGNOSIS — T84033A Mechanical loosening of internal left knee prosthetic joint, initial encounter: Principal | ICD-10-CM | POA: Diagnosis present

## 2023-04-14 DIAGNOSIS — Z888 Allergy status to other drugs, medicaments and biological substances status: Secondary | ICD-10-CM | POA: Diagnosis not present

## 2023-04-14 DIAGNOSIS — Z79890 Hormone replacement therapy (postmenopausal): Secondary | ICD-10-CM

## 2023-04-14 DIAGNOSIS — E039 Hypothyroidism, unspecified: Secondary | ICD-10-CM | POA: Diagnosis not present

## 2023-04-14 DIAGNOSIS — E7800 Pure hypercholesterolemia, unspecified: Secondary | ICD-10-CM | POA: Diagnosis present

## 2023-04-14 DIAGNOSIS — Z87891 Personal history of nicotine dependence: Secondary | ICD-10-CM

## 2023-04-14 DIAGNOSIS — N1831 Chronic kidney disease, stage 3a: Secondary | ICD-10-CM | POA: Diagnosis not present

## 2023-04-14 DIAGNOSIS — E538 Deficiency of other specified B group vitamins: Secondary | ICD-10-CM | POA: Diagnosis present

## 2023-04-14 DIAGNOSIS — K219 Gastro-esophageal reflux disease without esophagitis: Secondary | ICD-10-CM | POA: Diagnosis not present

## 2023-04-14 DIAGNOSIS — I4510 Unspecified right bundle-branch block: Secondary | ICD-10-CM | POA: Diagnosis not present

## 2023-04-14 DIAGNOSIS — G250 Essential tremor: Secondary | ICD-10-CM | POA: Diagnosis not present

## 2023-04-14 DIAGNOSIS — Z884 Allergy status to anesthetic agent status: Secondary | ICD-10-CM | POA: Diagnosis not present

## 2023-04-14 DIAGNOSIS — Z6841 Body Mass Index (BMI) 40.0 and over, adult: Secondary | ICD-10-CM

## 2023-04-14 DIAGNOSIS — E66813 Obesity, class 3: Secondary | ICD-10-CM | POA: Diagnosis not present

## 2023-04-14 DIAGNOSIS — Z96659 Presence of unspecified artificial knee joint: Principal | ICD-10-CM

## 2023-04-14 DIAGNOSIS — Y792 Prosthetic and other implants, materials and accessory orthopedic devices associated with adverse incidents: Secondary | ICD-10-CM | POA: Diagnosis present

## 2023-04-14 DIAGNOSIS — M25462 Effusion, left knee: Secondary | ICD-10-CM | POA: Diagnosis not present

## 2023-04-14 DIAGNOSIS — Z96652 Presence of left artificial knee joint: Secondary | ICD-10-CM | POA: Diagnosis not present

## 2023-04-14 DIAGNOSIS — E1122 Type 2 diabetes mellitus with diabetic chronic kidney disease: Secondary | ICD-10-CM | POA: Diagnosis not present

## 2023-04-14 DIAGNOSIS — Z885 Allergy status to narcotic agent status: Secondary | ICD-10-CM

## 2023-04-14 DIAGNOSIS — Z471 Aftercare following joint replacement surgery: Secondary | ICD-10-CM | POA: Diagnosis not present

## 2023-04-14 DIAGNOSIS — T84038A Mechanical loosening of other internal prosthetic joint, initial encounter: Principal | ICD-10-CM

## 2023-04-14 DIAGNOSIS — E119 Type 2 diabetes mellitus without complications: Secondary | ICD-10-CM

## 2023-04-14 DIAGNOSIS — Z01812 Encounter for preprocedural laboratory examination: Secondary | ICD-10-CM

## 2023-04-14 HISTORY — PX: TOTAL KNEE REVISION: SHX996

## 2023-04-14 LAB — GLUCOSE, CAPILLARY
Glucose-Capillary: 116 mg/dL — ABNORMAL HIGH (ref 70–99)
Glucose-Capillary: 158 mg/dL — ABNORMAL HIGH (ref 70–99)
Glucose-Capillary: 157 mg/dL — ABNORMAL HIGH (ref 70–99)

## 2023-04-14 SURGERY — TOTAL KNEE REVISION
Anesthesia: General | Site: Knee | Laterality: Left

## 2023-04-14 MED ORDER — ACETAMINOPHEN 10 MG/ML IV SOLN
INTRAVENOUS | Status: AC
  Filled 2023-04-14: qty 100

## 2023-04-14 MED ORDER — ACETAMINOPHEN 10 MG/ML IV SOLN
INTRAVENOUS | Status: DC | PRN
  Administered 2023-04-14: 1000 mg via INTRAVENOUS

## 2023-04-14 MED ORDER — SODIUM CHLORIDE FLUSH 0.9 % IV SOLN
INTRAVENOUS | Status: AC
  Filled 2023-04-14: qty 40

## 2023-04-14 MED ORDER — DEXAMETHASONE SODIUM PHOSPHATE 10 MG/ML IJ SOLN
8.0000 mg | Freq: Once | INTRAMUSCULAR | Status: AC
  Administered 2023-04-14: 8 mg via INTRAVENOUS

## 2023-04-14 MED ORDER — MIDAZOLAM HCL 5 MG/5ML IJ SOLN
INTRAMUSCULAR | Status: DC | PRN
  Administered 2023-04-14: 2 mg via INTRAVENOUS

## 2023-04-14 MED ORDER — LOPERAMIDE HCL 1 MG/7.5ML PO SUSP
1.0000 mg | ORAL | Status: DC | PRN
Start: 2023-04-14 — End: 2023-04-15

## 2023-04-14 MED ORDER — HYPROMELLOSE (GONIOSCOPIC) 2.5 % OP SOLN: 1.0000 [drp] | OPHTHALMIC | Status: DC | PRN

## 2023-04-14 MED ORDER — ALUM & MAG HYDROXIDE-SIMETH 200-200-20 MG/5ML PO SUSP
30.0000 mL | ORAL | Status: DC | PRN
Start: 2023-04-14 — End: 2023-04-15

## 2023-04-14 MED ORDER — CELECOXIB 200 MG PO CAPS
400.0000 mg | ORAL_CAPSULE | Freq: Once | ORAL | Status: AC
  Administered 2023-04-14: 400 mg via ORAL

## 2023-04-14 MED ORDER — KETAMINE HCL 50 MG/5ML IJ SOSY
PREFILLED_SYRINGE | INTRAMUSCULAR | Status: DC | PRN
  Administered 2023-04-14: 30 mg via INTRAVENOUS
  Administered 2023-04-14: 20 mg via INTRAVENOUS

## 2023-04-14 MED ORDER — PHENYLEPHRINE HCL-NACL 20-0.9 MG/250ML-% IV SOLN
INTRAVENOUS | Status: DC | PRN
  Administered 2023-04-14: 40 ug/min via INTRAVENOUS

## 2023-04-14 MED ORDER — PHENYLEPHRINE 80 MCG/ML (10ML) SYRINGE FOR IV PUSH (FOR BLOOD PRESSURE SUPPORT)
PREFILLED_SYRINGE | INTRAVENOUS | Status: DC | PRN
  Administered 2023-04-14 (×2): 80 ug via INTRAVENOUS

## 2023-04-14 MED ORDER — ONDANSETRON HCL 4 MG/2ML IJ SOLN: 4.0000 mg | Freq: Four times a day (QID) | INTRAMUSCULAR | Status: DC | PRN

## 2023-04-14 MED ORDER — OXYCODONE HCL 5 MG PO TABS
10.0000 mg | ORAL_TABLET | ORAL | Status: DC | PRN
  Administered 2023-04-14: 10 mg via ORAL

## 2023-04-14 MED ORDER — BUPIVACAINE HCL (PF) 0.25 % IJ SOLN
INTRAMUSCULAR | Status: AC
  Filled 2023-04-14: qty 60

## 2023-04-14 MED ORDER — OXYCODONE HCL 5 MG PO TABS: 5.0000 mg | ORAL_TABLET | ORAL | Status: DC | PRN

## 2023-04-14 MED ORDER — ALIGN 4 MG PO CAPS: 4.0000 mg | ORAL_CAPSULE | Freq: Every day | ORAL | Status: DC

## 2023-04-14 MED ORDER — GABAPENTIN 300 MG PO CAPS
300.0000 mg | ORAL_CAPSULE | Freq: Once | ORAL | Status: AC
  Administered 2023-04-14: 300 mg via ORAL

## 2023-04-14 MED ORDER — CHLORHEXIDINE GLUCONATE 0.12 % MT SOLN
15.0000 mL | Freq: Once | OROMUCOSAL | Status: AC
Start: 2023-04-14 — End: 2023-04-14
  Administered 2023-04-14: 15 mL via OROMUCOSAL

## 2023-04-14 MED ORDER — OXYCODONE HCL 5 MG PO TABS: 5.0000 mg | ORAL_TABLET | Freq: Once | ORAL | Status: DC | PRN

## 2023-04-14 MED ORDER — ACETAMINOPHEN 10 MG/ML IV SOLN
1000.0000 mg | Freq: Four times a day (QID) | INTRAVENOUS | Status: DC
  Administered 2023-04-14 – 2023-04-15 (×3): 1000 mg via INTRAVENOUS

## 2023-04-14 MED ORDER — ONDANSETRON HCL 4 MG/2ML IJ SOLN
INTRAMUSCULAR | Status: DC | PRN
  Administered 2023-04-14: 4 mg via INTRAVENOUS

## 2023-04-14 MED ORDER — INSULIN ASPART 100 UNIT/ML IJ SOLN
0.0000 [IU] | Freq: Three times a day (TID) | INTRAMUSCULAR | Status: DC
  Administered 2023-04-15: 2 [IU] via SUBCUTANEOUS

## 2023-04-14 MED ORDER — OXYCODONE HCL 5 MG PO TABS
ORAL_TABLET | ORAL | Status: AC
  Filled 2023-04-14: qty 2

## 2023-04-14 MED ORDER — TRAMADOL HCL 50 MG PO TABS: 50.0000 mg | ORAL_TABLET | ORAL | Status: DC | PRN

## 2023-04-14 MED ORDER — LEVOTHYROXINE SODIUM 25 MCG PO TABS
150.0000 ug | ORAL_TABLET | Freq: Every day | ORAL | Status: DC
  Administered 2023-04-15: 150 ug via ORAL
  Filled 2023-04-14: qty 1

## 2023-04-14 MED ORDER — TRANEXAMIC ACID-NACL 1000-0.7 MG/100ML-% IV SOLN
INTRAVENOUS | Status: AC
  Filled 2023-04-14: qty 100

## 2023-04-14 MED ORDER — FENTANYL CITRATE (PF) 100 MCG/2ML IJ SOLN
INTRAMUSCULAR | Status: DC | PRN
  Administered 2023-04-14: 100 ug via INTRAVENOUS

## 2023-04-14 MED ORDER — INSULIN ASPART 100 UNIT/ML IJ SOLN
0.0000 [IU] | Freq: Every day | INTRAMUSCULAR | Status: DC
Start: 2023-04-14 — End: 2023-04-15

## 2023-04-14 MED ORDER — MAGNESIUM HYDROXIDE 400 MG/5ML PO SUSP: 30.0000 mL | Freq: Every day | ORAL | Status: DC

## 2023-04-14 MED ORDER — SUGAMMADEX SODIUM 200 MG/2ML IV SOLN
INTRAVENOUS | Status: DC | PRN
  Administered 2023-04-14: 200 mg via INTRAVENOUS

## 2023-04-14 MED ORDER — HYDROMORPHONE HCL 1 MG/ML IJ SOLN
0.5000 mg | INTRAMUSCULAR | Status: DC | PRN
Start: 2023-04-14 — End: 2023-04-15

## 2023-04-14 MED ORDER — MECLIZINE HCL 25 MG PO TABS: 25.0000 mg | ORAL_TABLET | Freq: Three times a day (TID) | ORAL | Status: DC | PRN

## 2023-04-14 MED ORDER — ACETAMINOPHEN 325 MG PO TABS: 325.0000 mg | ORAL_TABLET | Freq: Four times a day (QID) | ORAL | Status: DC | PRN

## 2023-04-14 MED ORDER — FERROUS SULFATE 325 (65 FE) MG PO TABS
ORAL_TABLET | ORAL | Status: AC
  Filled 2023-04-14: qty 1

## 2023-04-14 MED ORDER — MIDAZOLAM HCL 2 MG/2ML IJ SOLN
INTRAMUSCULAR | Status: AC
  Filled 2023-04-14: qty 2

## 2023-04-14 MED ORDER — CHLORHEXIDINE GLUCONATE 4 % EX SOLN: 60.0000 mL | Freq: Once | CUTANEOUS | Status: DC

## 2023-04-14 MED ORDER — PHENOL 1.4 % MT LIQD: 1.0000 | OROMUCOSAL | Status: DC | PRN

## 2023-04-14 MED ORDER — SURGIPHOR WOUND IRRIGATION SYSTEM - OPTIME: TOPICAL | Status: DC | PRN

## 2023-04-14 MED ORDER — CEFAZOLIN SODIUM-DEXTROSE 2-4 GM/100ML-% IV SOLN
INTRAVENOUS | Status: AC
  Filled 2023-04-14: qty 100

## 2023-04-14 MED ORDER — SENNOSIDES-DOCUSATE SODIUM 8.6-50 MG PO TABS
1.0000 | ORAL_TABLET | Freq: Two times a day (BID) | ORAL | Status: DC
  Administered 2023-04-14 – 2023-04-15 (×2): 1 via ORAL

## 2023-04-14 MED ORDER — CELECOXIB 200 MG PO CAPS
200.0000 mg | ORAL_CAPSULE | Freq: Two times a day (BID) | ORAL | Status: DC
  Administered 2023-04-14 – 2023-04-15 (×2): 200 mg via ORAL

## 2023-04-14 MED ORDER — PANTOPRAZOLE SODIUM 40 MG PO TBEC
40.0000 mg | DELAYED_RELEASE_TABLET | Freq: Two times a day (BID) | ORAL | Status: DC
  Administered 2023-04-14 – 2023-04-15 (×2): 40 mg via ORAL

## 2023-04-14 MED ORDER — PANTOPRAZOLE SODIUM 40 MG PO TBEC
DELAYED_RELEASE_TABLET | ORAL | Status: AC
  Filled 2023-04-14: qty 1

## 2023-04-14 MED ORDER — ORAL CARE MOUTH RINSE: 15.0000 mL | Freq: Once | OROMUCOSAL | Status: AC

## 2023-04-14 MED ORDER — FENTANYL CITRATE (PF) 100 MCG/2ML IJ SOLN
INTRAMUSCULAR | Status: AC
  Filled 2023-04-14: qty 2

## 2023-04-14 MED ORDER — GABAPENTIN 300 MG PO CAPS
ORAL_CAPSULE | ORAL | Status: AC
  Filled 2023-04-14: qty 1

## 2023-04-14 MED ORDER — LORATADINE 10 MG PO TABS: 10.0000 mg | ORAL_TABLET | Freq: Every day | ORAL | Status: DC | PRN

## 2023-04-14 MED ORDER — POLYVINYL ALCOHOL 1.4 % OP SOLN: 1.0000 [drp] | OPHTHALMIC | Status: DC | PRN

## 2023-04-14 MED ORDER — PIOGLITAZONE HCL 30 MG PO TABS
30.0000 mg | ORAL_TABLET | Freq: Every day | ORAL | Status: DC
  Administered 2023-04-15: 30 mg via ORAL
  Filled 2023-04-14: qty 1

## 2023-04-14 MED ORDER — TELMISARTAN-HCTZ 80-25 MG PO TABS: 1.0000 | ORAL_TABLET | Freq: Every day | ORAL | Status: DC

## 2023-04-14 MED ORDER — METOCLOPRAMIDE HCL 10 MG PO TABS
10.0000 mg | ORAL_TABLET | Freq: Three times a day (TID) | ORAL | Status: DC
  Administered 2023-04-14 – 2023-04-15 (×2): 10 mg via ORAL

## 2023-04-14 MED ORDER — AMLODIPINE BESYLATE 10 MG PO TABS: 5.0000 mg | ORAL_TABLET | Freq: Every day | ORAL | Status: DC

## 2023-04-14 MED ORDER — CEFAZOLIN IN SODIUM CHLORIDE 3-0.9 GM/100ML-% IV SOLN
3.0000 g | INTRAVENOUS | Status: AC
  Administered 2023-04-14: 3 g via INTRAVENOUS
  Filled 2023-04-14: qty 100

## 2023-04-14 MED ORDER — PROPOFOL 10 MG/ML IV BOLUS
INTRAVENOUS | Status: DC | PRN
  Administered 2023-04-14: 200 mg via INTRAVENOUS

## 2023-04-14 MED ORDER — DIPHENHYDRAMINE HCL 12.5 MG/5ML PO ELIX: 12.5000 mg | ORAL_SOLUTION | ORAL | Status: DC | PRN

## 2023-04-14 MED ORDER — ROCURONIUM BROMIDE 100 MG/10ML IV SOLN
INTRAVENOUS | Status: DC | PRN
  Administered 2023-04-14: 70 mg via INTRAVENOUS

## 2023-04-14 MED ORDER — FERROUS SULFATE 325 (65 FE) MG PO TABS
325.0000 mg | ORAL_TABLET | Freq: Two times a day (BID) | ORAL | Status: DC
  Administered 2023-04-14 – 2023-04-15 (×2): 325 mg via ORAL

## 2023-04-14 MED ORDER — SENNOSIDES-DOCUSATE SODIUM 8.6-50 MG PO TABS
ORAL_TABLET | ORAL | Status: AC
  Filled 2023-04-14: qty 1

## 2023-04-14 MED ORDER — RISAQUAD PO CAPS
1.0000 | ORAL_CAPSULE | Freq: Every day | ORAL | Status: DC
  Administered 2023-04-15: 1 via ORAL
  Filled 2023-04-14: qty 1

## 2023-04-14 MED ORDER — CELECOXIB 200 MG PO CAPS
ORAL_CAPSULE | ORAL | Status: AC
  Filled 2023-04-14: qty 2

## 2023-04-14 MED ORDER — METFORMIN HCL 500 MG PO TABS
ORAL_TABLET | ORAL | Status: AC
  Filled 2023-04-14: qty 1

## 2023-04-14 MED ORDER — SODIUM CHLORIDE 0.9 % IV SOLN
3.0000 g | INTRAVENOUS | Status: DC
  Filled 2023-04-14 (×2): qty 3

## 2023-04-14 MED ORDER — ACETAMINOPHEN 10 MG/ML IV SOLN: 1000.0000 mg | Freq: Once | INTRAVENOUS | Status: DC | PRN

## 2023-04-14 MED ORDER — ASPIRIN 81 MG PO CHEW
81.0000 mg | CHEWABLE_TABLET | Freq: Two times a day (BID) | ORAL | Status: DC
  Administered 2023-04-14 – 2023-04-15 (×2): 81 mg via ORAL

## 2023-04-14 MED ORDER — KETAMINE HCL 50 MG/5ML IJ SOSY
PREFILLED_SYRINGE | INTRAMUSCULAR | Status: AC
  Filled 2023-04-14: qty 5

## 2023-04-14 MED ORDER — CEFAZOLIN SODIUM-DEXTROSE 2-4 GM/100ML-% IV SOLN
2.0000 g | Freq: Four times a day (QID) | INTRAVENOUS | Status: AC
  Administered 2023-04-14 (×2): 2 g via INTRAVENOUS

## 2023-04-14 MED ORDER — METFORMIN HCL 500 MG PO TABS
500.0000 mg | ORAL_TABLET | Freq: Two times a day (BID) | ORAL | Status: DC
  Administered 2023-04-14 – 2023-04-15 (×2): 500 mg via ORAL

## 2023-04-14 MED ORDER — BUPIVACAINE LIPOSOME 1.3 % IJ SUSP
INTRAMUSCULAR | Status: AC
  Filled 2023-04-14: qty 20

## 2023-04-14 MED ORDER — DEXAMETHASONE SODIUM PHOSPHATE 10 MG/ML IJ SOLN
INTRAMUSCULAR | Status: AC
  Filled 2023-04-14: qty 1

## 2023-04-14 MED ORDER — MENTHOL 3 MG MT LOZG: 1.0000 | LOZENGE | OROMUCOSAL | Status: DC | PRN

## 2023-04-14 MED ORDER — CHLORHEXIDINE GLUCONATE 0.12 % MT SOLN
OROMUCOSAL | Status: AC
  Filled 2023-04-14: qty 15

## 2023-04-14 MED ORDER — SODIUM CHLORIDE 0.9 % IV SOLN: INTRAVENOUS | Status: DC

## 2023-04-14 MED ORDER — ONDANSETRON HCL 4 MG/2ML IJ SOLN: 4.0000 mg | Freq: Once | INTRAMUSCULAR | Status: DC | PRN

## 2023-04-14 MED ORDER — ASPIRIN 81 MG PO CHEW
CHEWABLE_TABLET | ORAL | Status: AC
  Filled 2023-04-14: qty 1

## 2023-04-14 MED ORDER — METOCLOPRAMIDE HCL 10 MG PO TABS
ORAL_TABLET | ORAL | Status: AC
  Filled 2023-04-14: qty 1

## 2023-04-14 MED ORDER — SODIUM CHLORIDE (PF) 0.9 % IJ SOLN
INTRAMUSCULAR | Status: DC | PRN
  Administered 2023-04-14: 120 mL via INTRAMUSCULAR

## 2023-04-14 MED ORDER — FENTANYL CITRATE (PF) 100 MCG/2ML IJ SOLN
25.0000 ug | INTRAMUSCULAR | Status: DC | PRN
  Administered 2023-04-14 (×2): 25 ug via INTRAVENOUS

## 2023-04-14 MED ORDER — SODIUM CHLORIDE 0.9 % IR SOLN
Status: DC | PRN
  Administered 2023-04-14: 3000 mL

## 2023-04-14 MED ORDER — GLYCOPYRROLATE 0.2 MG/ML IJ SOLN
INTRAMUSCULAR | Status: DC | PRN
  Administered 2023-04-14: .2 mg via INTRAVENOUS

## 2023-04-14 MED ORDER — CELECOXIB 200 MG PO CAPS
ORAL_CAPSULE | ORAL | Status: AC
  Filled 2023-04-14: qty 1

## 2023-04-14 MED ORDER — BISACODYL 10 MG RE SUPP: 10.0000 mg | Freq: Every day | RECTAL | Status: DC | PRN

## 2023-04-14 MED ORDER — ONDANSETRON HCL 4 MG PO TABS: 4.0000 mg | ORAL_TABLET | Freq: Four times a day (QID) | ORAL | Status: DC | PRN

## 2023-04-14 MED ORDER — LIDOCAINE HCL (CARDIAC) PF 100 MG/5ML IV SOSY
PREFILLED_SYRINGE | INTRAVENOUS | Status: DC | PRN
  Administered 2023-04-14: 100 mg via INTRAVENOUS

## 2023-04-14 MED ORDER — TRANEXAMIC ACID-NACL 1000-0.7 MG/100ML-% IV SOLN
1000.0000 mg | Freq: Once | INTRAVENOUS | Status: AC
  Administered 2023-04-14: 1000 mg via INTRAVENOUS

## 2023-04-14 MED ORDER — OXYCODONE HCL 5 MG/5ML PO SOLN: 5.0000 mg | Freq: Once | ORAL | Status: DC | PRN

## 2023-04-14 MED ORDER — PROPRANOLOL HCL 40 MG PO TABS
80.0000 mg | ORAL_TABLET | Freq: Two times a day (BID) | ORAL | Status: DC
  Filled 2023-04-14: qty 2

## 2023-04-14 MED ORDER — TRANEXAMIC ACID-NACL 1000-0.7 MG/100ML-% IV SOLN
1000.0000 mg | INTRAVENOUS | Status: AC
  Administered 2023-04-14: 1000 mg via INTRAVENOUS

## 2023-04-14 MED ORDER — FLEET ENEMA RE ENEM
1.0000 | ENEMA | Freq: Once | RECTAL | Status: DC | PRN
Start: 2023-04-14 — End: 2023-04-15

## 2023-04-14 SURGICAL SUPPLY — 87 items
BIT DRILL QUICK REL 1/8 2PK SL (BIT) ×1 IMPLANT
BLADE SAGITTAL 25.0X1.19X90 (BLADE) IMPLANT
BLADE SAW 70X12.5 (BLADE) IMPLANT
BLADE SAW 90X13X1.19 OSCILLAT (BLADE) IMPLANT
BLADE SAW 90X25X1.19 OSCILLAT (BLADE) IMPLANT
BLADE SAW SGTL 13X75X1.27 (BLADE) IMPLANT
BLADE SW THK.38XMED LNG THN (BLADE) IMPLANT
BONE CEMENT GENTAMICIN (Cement) ×2 IMPLANT
BRUSH SCRUB EZ PLAIN DRY (MISCELLANEOUS) ×1 IMPLANT
CANISTER WOUND CARE 500ML ATS (WOUND CARE) IMPLANT
CEMENT BONE GENTAMICIN 40 (Cement) IMPLANT
CNTNR URN SCR LID CUP LEK RST (MISCELLANEOUS) IMPLANT
COOLER POLAR GLACIER W/PUMP (MISCELLANEOUS) ×1 IMPLANT
CUFF TRNQT CYL 24X4X16.5-23 (TOURNIQUET CUFF) IMPLANT
CUFF TRNQT CYL 30X4X21-28X (TOURNIQUET CUFF) IMPLANT
DRAPE INCISE IOBAN 66X60 STRL (DRAPES) IMPLANT
DRAPE SHEET LG 3/4 BI-LAMINATE (DRAPES) ×2 IMPLANT
DRAPE TABLE BACK 80X90 (DRAPES) ×1 IMPLANT
DRESSING PEEL AND PLAC PRVNA20 (GAUZE/BANDAGES/DRESSINGS) IMPLANT
DRSG AQUACEL AG ADV 3.5X14 (GAUZE/BANDAGES/DRESSINGS) IMPLANT
DRSG MEPILEX SACRM 8.7X9.8 (GAUZE/BANDAGES/DRESSINGS) ×1 IMPLANT
DRSG NON-ADHERENT DERMACEA 3X4 (GAUZE/BANDAGES/DRESSINGS) IMPLANT
DRSG PEEL AND PLACE PREVENA 20 (GAUZE/BANDAGES/DRESSINGS)
DRSG TEGADERM 4X4.75 (GAUZE/BANDAGES/DRESSINGS) IMPLANT
DURAPREP 26ML APPLICATOR (WOUND CARE) ×2 IMPLANT
ELECT CAUTERY BLADE 6.4 (BLADE) ×1 IMPLANT
ELECT REM PT RETURN 9FT ADLT (ELECTROSURGICAL) ×1
ELECTRODE REM PT RTRN 9FT ADLT (ELECTROSURGICAL) ×1 IMPLANT
EVACUATOR 1/8 PVC DRAIN (DRAIN) IMPLANT
GAUZE SPONGE 4X4 12PLY STRL (GAUZE/BANDAGES/DRESSINGS) IMPLANT
GLOVE BIOGEL M STRL SZ7.5 (GLOVE) ×6 IMPLANT
GLOVE SURG UNDER POLY LF SZ8 (GLOVE) ×2 IMPLANT
GOWN STRL REUS W/ TWL LRG LVL3 (GOWN DISPOSABLE) ×1 IMPLANT
GOWN STRL REUS W/ TWL XL LVL3 (GOWN DISPOSABLE) ×1 IMPLANT
GOWN TOGA ZIPPER T7+ PEEL AWAY (MISCELLANEOUS) ×1 IMPLANT
HANDPIECE VERSAJET DEBRIDEMENT (MISCELLANEOUS) IMPLANT
HOLDER FOLEY CATH W/STRAP (MISCELLANEOUS) ×1 IMPLANT
HOOD PEEL AWAY T7 (MISCELLANEOUS) ×1 IMPLANT
IV NS IRRIG 3000ML ARTHROMATIC (IV SOLUTION) ×4 IMPLANT
KIT TURNOVER KIT A (KITS) ×1 IMPLANT
KNIFE SCULPS 14X20 (INSTRUMENTS) IMPLANT
LABEL OR SOLS (LABEL) ×1 IMPLANT
MANIFOLD NEPTUNE II (INSTRUMENTS) ×2 IMPLANT
MAT ABSORB FLUID 56X50 GRAY (MISCELLANEOUS) ×1 IMPLANT
NDL SAFETY ECLIPSE 18X1.5 (NEEDLE) ×2 IMPLANT
NDL SPNL 20GX3.5 QUINCKE YW (NEEDLE) IMPLANT
NEEDLE SPNL 20GX3.5 QUINCKE YW (NEEDLE) ×2
NS IRRIG 1000ML POUR BTL (IV SOLUTION) ×1 IMPLANT
PACK COLON CLEAN CLOSURE (MISCELLANEOUS) IMPLANT
PACK TOTAL KNEE (MISCELLANEOUS) ×1 IMPLANT
PAD ABD DERMACEA PRESS 5X9 (GAUZE/BANDAGES/DRESSINGS) IMPLANT
PAD ARMBOARD 7.5X6 YLW CONV (MISCELLANEOUS) ×3 IMPLANT
PAD WRAPON POLAR KNEE (MISCELLANEOUS) ×1 IMPLANT
PAD WRAPON POLOR MULTI XL (MISCELLANEOUS) IMPLANT
PENCIL SMOKE EVACUATOR COATED (MISCELLANEOUS) ×1 IMPLANT
PLATE ROT INSERT 12.5MM SIZE 4 (Plate) IMPLANT
PULSAVAC PLUS IRRIG FAN TIP (DISPOSABLE) ×1
SLEEVE MBT PROUS ML 29MM (Knees) IMPLANT
SOLUTION IRRIG SURGIPHOR (IV SOLUTION) ×1 IMPLANT
SPONGE DRAIN TRACH 4X4 STRL 2S (GAUZE/BANDAGES/DRESSINGS) IMPLANT
SPONGE T-LAP 18X18 ~~LOC~~+RFID (SPONGE) IMPLANT
STAPLER SKIN PROX 35W (STAPLE) IMPLANT
STEM UNIVERSAL REVISION 75X12 (Stem) IMPLANT
STOCKINETTE IMPERV 14X48 (MISCELLANEOUS) ×1 IMPLANT
STOCKINETTE M/LG 89821 (MISCELLANEOUS) IMPLANT
STOCKINETTE STRL BIAS CUT 8X4 (MISCELLANEOUS) ×1 IMPLANT
STRAP TIBIA SHORT (MISCELLANEOUS) ×1 IMPLANT
SUCTION TUBE FRAZIER 10FR DISP (SUCTIONS) ×1 IMPLANT
SUT MNCRL 0 1X36 CT-1 (SUTURE) IMPLANT
SUT MON AB 2-0 CT1 36 (SUTURE) IMPLANT
SUT PDS AB 1 CT 36 (SUTURE) IMPLANT
SUT VIC AB 0 CT1 36 (SUTURE) IMPLANT
SUT VIC AB 1 CT1 36 (SUTURE) IMPLANT
SUT VIC AB 2-0 CT1 (SUTURE) IMPLANT
SWAB CULTURE AMIES ANAERIB BLU (MISCELLANEOUS) IMPLANT
SYR 20ML LL LF (SYRINGE) ×2 IMPLANT
SYR 30ML LL (SYRINGE) IMPLANT
TIP BRUSH PULSAVAC PLUS 24.33 (MISCELLANEOUS) IMPLANT
TIP FAN IRRIG PULSAVAC PLUS (DISPOSABLE) ×1 IMPLANT
TOWEL OR 17X26 4PK STRL BLUE (TOWEL DISPOSABLE) IMPLANT
TOWER CARTRIDGE SMART MIX (DISPOSABLE) IMPLANT
TRAP FLUID SMOKE EVACUATOR (MISCELLANEOUS) ×1 IMPLANT
TRAY FOLEY MTR SLVR 16FR STAT (SET/KITS/TRAYS/PACK) ×1 IMPLANT
TRAY TIBIAL REV MBT SZ 4 15MM (Knees) IMPLANT
WATER STERILE IRR 1000ML POUR (IV SOLUTION) ×1 IMPLANT
WRAP-ON POLOR PAD MULTI XL (MISCELLANEOUS) ×1
WRAPON POLAR PAD KNEE (MISCELLANEOUS)

## 2023-04-14 NOTE — TOC Progression Note (Signed)
 Transition of Care [facility]) - Progression Note    Patient Details  Name: Cindy Wolfe MRN: [id number] Date of Birth: 02/03/1946  Transition of Care [facility]) CM/SW Contact  Royanne JINNY Bernheim, RN Phone Number: 04/14/2023, 1:14 PM  Clinical Narrative:    [facility] accepted the patient for [facility]        Expected Discharge Plan and Services                                               Social Determinants of Health (SDOH) Interventions SDOH Screenings   Food Insecurity: No Food Insecurity (10/19/2022)   Received from [facility]  Housing: Low Risk  (04/08/2023)   Received from [facility]  Transportation Needs: No Transportation Needs (10/19/2022)   Received from [facility]  Utilities: Not At Risk (10/19/2022)   Received from [facility]  Financial Resource Strain: Low Risk  (10/19/2022)   Received from [facility]  Tobacco Use: Medium Risk (04/14/2023)    Readmission Risk Interventions     No data to display

## 2023-04-14 NOTE — Interval H&P Note (Signed)
 History and Physical Interval Note:  04/14/2023 11:09 AM  Cindy Wolfe  has presented today for surgery, with the diagnosis of Loose orthopedic implant initial encounter CMS-HCC T84.039A Chronic pain of left knee M25.562, G89.29.  The various methods of treatment have been discussed with the patient and family. After consideration of risks, benefits and other options for treatment, the patient has consented to  Procedure(s): TOTAL KNEE REVISION (Left) as a surgical intervention.  The patient's history has been reviewed, patient examined, no change in status, stable for surgery.  I have reviewed the patient's chart and labs.  Questions were answered to the patient's satisfaction.     Ladarious Kresse P Sherlonda Flater

## 2023-04-14 NOTE — Transfer of Care (Signed)
 Immediate Anesthesia Transfer of Care Note  Patient: Cindy Wolfe  Procedure(s) Performed: TOTAL KNEE REVISION (Left: Knee)  Patient Location: PACU  Anesthesia Type:General  Level of Consciousness: drowsy and patient cooperative  Airway & Oxygen Therapy: Patient Spontanous Breathing and Patient connected to face mask oxygen  Post-op Assessment: Report given to RN and Post -op Vital signs reviewed and stable  Post vital signs: stable  Last Vitals:  Vitals Value Taken Time  BP 124/62 04/14/23 1615  Temp    Pulse 57 04/14/23 1618  Resp 15 04/14/23 1618  SpO2 100 % 04/14/23 1618  Vitals shown include unfiled device data.  Last Pain:  Vitals:   04/14/23 1001  TempSrc: Oral  PainSc: 0-No pain         Complications: No notable events documented.

## 2023-04-14 NOTE — Anesthesia Postprocedure Evaluation (Signed)
 Anesthesia Post Note  Patient: Cindy Wolfe  Procedure(s) Performed: TOTAL KNEE REVISION (Left: Knee)  Patient location during evaluation: PACU Anesthesia Type: General Level of consciousness: awake and alert, oriented and patient cooperative Pain management: pain level controlled Vital Signs Assessment: post-procedure vital signs reviewed and stable Respiratory status: spontaneous breathing, nonlabored ventilation and respiratory function stable Cardiovascular status: blood pressure returned to baseline and stable Postop Assessment: adequate PO intake Anesthetic complications: no   There were no known notable events for this encounter.   Last Vitals:  Vitals:   04/14/23 1618 04/14/23 1645  BP:    Pulse: (!) 59 62  Resp: 14 15  Temp:    SpO2: 99% 97%    Last Pain:  Vitals:   04/14/23 1645  TempSrc:   PainSc: 6                  Jettson Crable

## 2023-04-14 NOTE — Op Note (Signed)
 OPERATIVE NOTE  DATE OF SURGERY:  04/14/2023  PATIENT NAME:  [name]   DOB: [birth year]  MRN: [id number]  PRE-OPERATIVE DIAGNOSIS: Implant loosening, status post left total knee arthroplasty  POST-OPERATIVE DIAGNOSIS:  Same  PROCEDURE:  Left knee revision arthroplasty (tibial component)  SURGEON:  Lynwood SHAUNNA Mardee Mickey. M.D.  ASSISTANT:  Sidra Koyanagi, PA-C (present and scrubbed throughout the case, critical for assistance with exposure, retraction, instrumentation, and closure)  ANESTHESIA: general  ESTIMATED BLOOD LOSS: 50 mL  FLUIDS REPLACED: 700 mL of crystalloid  TOURNIQUET TIME: 131 minutes  DRAINS: 2 medium Hemovac drains  IMPLANTS UTILIZED: DePuy size 4  15 mm MBT revision tibial component (cemented), 28 mm MBT revision metaphyseal sleeve (porous), 75 mm x 12 mm universal fluted stem, and a 12.5 mm stabilized rotating platform polyethylene insert.  INDICATIONS FOR SURGERY: Cindy Wolfe is a 78 y.o. year old female who underwent a left total knee arthroplasty approximately 8 years ago.  She presented with complaints of left knee pain and progressive varus deformity of the knee.  X-rays demonstrated findings consistent with loosening of the tibial component.  After discussion of the risks and benefits of surgical intervention, the patient expressed understanding of the risks benefits and agree with plans for left knee revision arthroplasty.   The risks, benefits, and alternatives were discussed at length including but not limited to the risks of infection, bleeding, nerve injury, stiffness, blood clots, the need for revision surgery, cardiopulmonary complications, among others, and they were willing to proceed.  PROCEDURE IN DETAIL: The patient was brought into the operating room and, after adequate general anesthesia was achieved, a tourniquet was placed on the patient's upper thigh. The patient's knee and leg were cleaned and prepped with alcohol  and DuraPrep  and draped in the usual sterile fashion. A timeout was performed as per usual protocol. The lower extremity was exsanguinated using an Esmarch, and the tourniquet was inflated to 300 mmHg. An anterior longitudinal incision was made followed by a standard medial parapatellar approach. The deep fibers of the medial collateral ligament were elevated in a subperiosteal fashion off of the medial flare of the tibia so as to maintain a continuous soft tissue sleeve. The patella was subluxed laterally and inspected.  The patella component was stable without evidence of loosening or wear.  An extensive synovectomy was performed so to recreate the medial and lateral gutters.  The interface between the femoral implant and the bone was inspected and noted to be stable.  Next, attention was directed to the tibial component.  The polyethylene was removed without difficulty.  The tibial baseplate was grossly loose and was removed without difficulty.  Fragmentation of the underlying cement was noted and this was removed.  There was an obvious fibrous film between the bone and the cement and this too was removed using combination of curettes and rongeurs.  There was significant bone loss especially to the medial tibial plateau.  The tibial canal was reamed in a sequential fashion up to a 12 mm diameter.  This allowed for good scratch fit.  The intramedullary cutting guide was positioned so as to create a minimal along the medial aspect of the tibia.  The guide rod was removed and proximal reaming was performed in anticipation of the MBT revision tray.  Next, a broach for a 28 mm tibial sleeve was inserted and impacted into place.  Attachment for the 15 mm size 4 MBT revision tray was placed followed by insertion of first  a 10 and subsequently a 12.5 mm polyethylene trial.  This allowed for full extension and excellent flexion.  Good alignment was noted.  Trial components were removed.  The cut surface of bone was irrigated with  copious amounts normal saline and then suctioned dry.  The tibial construct was created by using the size 415 mm MBT revision thick tray, a 28 mm porous metaphyseal MBT revision sleeve, and a 75 mm x 12 mm universal fluted stem. Polymethylmethacrylate cement with gentamicin was prepared in the usual fashion using a vacuum mixer. Cement was applied to the cut surface of the proximal tibia as well as along the undersurface of the MBT revision tibial component. Tibial component was positioned and impacted into place. Excess cement was removed using Personal assistant. A 12.5 mm polyethylene trial was inserted and the knee was brought into full extension with steady axial compression applied. After adequate curing of the cement, the tourniquet was deflated after a total tourniquet time of 131 minutes. Hemostasis was achieved using electrocautery. The knee was irrigated with copious amounts of normal saline using pulsatile lavage followed by 450 ml of Surgiphor and then suctioned dry. 20 mL of 1.3% Exparel  and 60 mL of 0.25% Marcaine  in 40 mL of normal saline was injected along the posterior capsule, medial and lateral gutters, and along the arthrotomy site. A 12.5 mm stabilized rotating platform polyethylene insert was inserted and the knee was placed through a range of motion with excellent mediolateral soft tissue balancing appreciated and excellent patellar tracking noted. 2 medium drains were placed in the wound bed and brought out through separate stab incisions. The medial parapatellar portion of the incision was reapproximated using interrupted sutures of #1 Vicryl. Subcutaneous tissue was approximated in layers using first #0 Vicryl followed #2-0 Vicryl. The skin was approximated with skin staples. A sterile dressing was applied.  The patient tolerated the procedure well and was transported to the recovery room in stable condition.    Weslie Rasmus P. Mayanna Garlitz, Jr., M.D.

## 2023-04-14 NOTE — Anesthesia Procedure Notes (Signed)
 Procedure Name: Intubation Date/Time: 04/14/2023 12:18 PM  Performed by: Norleen Alberta HERO., CRNAPre-anesthesia Checklist: Patient identified, Patient being monitored, Timeout performed, Emergency Drugs available and Suction available Patient Re-evaluated:Patient Re-evaluated prior to induction Oxygen Delivery Method: Circle system utilized Preoxygenation: Pre-oxygenation with 100% oxygen Induction Type: IV induction Ventilation: Mask ventilation without difficulty Laryngoscope Size: 3 and McGrath Grade View: Grade I Tube type: Oral Tube size: 7.0 mm Number of attempts: 1 Airway Equipment and Method: Stylet Placement Confirmation: ETT inserted through vocal cords under direct vision, positive ETCO2 and breath sounds checked- equal and bilateral Secured at: 19 cm Tube secured with: Tape Dental Injury: Teeth and Oropharynx as per pre-operative assessment

## 2023-04-14 NOTE — Anesthesia Preprocedure Evaluation (Signed)
 Anesthesia Evaluation  Patient identified by MRN, date of birth, ID band Patient awake    Reviewed: Allergy & Precautions, NPO status , Patient's Chart, lab work & pertinent test results  History of Anesthesia Complications (+) PONV and history of anesthetic complications  Airway Mallampati: III  TM Distance: >3 FB Neck ROM: Full    Dental  (+) Partial Upper   Pulmonary sleep apnea and Continuous Positive Airway Pressure Ventilation , neg COPD, Patient abstained from smoking.Not current smoker, former smoker   Pulmonary exam normal breath sounds clear to auscultation       Cardiovascular Exercise Tolerance: Good METShypertension, Pt. on medications (-) CAD and (-) Past MI + dysrhythmias  Rhythm:Regular Rate:Normal - Systolic murmurs    Neuro/Psych  Headaches  negative psych ROS   GI/Hepatic ,GERD  Controlled,,(+)     (-) substance abuse    Endo/Other  diabetesHypothyroidism  Class 3 obesity  Renal/GU CRFRenal disease     Musculoskeletal   Abdominal  (+) + obese  Peds  Hematology   Anesthesia Other Findings Past Medical History: No date: Anemia No date: Arthritis No date: Complication of anesthesia     Comment:  1 time I had a hard time waking up. No date: Diverticulosis No date: DJD (degenerative joint disease) No date: Family history of adverse reaction to anesthesia     Comment:  daughter post op nausea and vomiting  No date: GERD (gastroesophageal reflux disease) No date: Hemorrhoids 09/28/2012: History of abnormal mammogram     Comment:  BIRADS 2 AT [facility] 2012: History of bone density study     Comment:  C PCP; WNL 07/03/10; 10/02/13: History of Papanicolaou smear of cervix     Comment:  -/-; -/- No date: Hypercholesterolemia     Comment:  CAN'T TAKE MEDS No date: Hyperlipidemia No date: Hypertension, essential No date: Hypothyroidism No date: IBS (irritable bowel syndrome) No date: Migraine No  date: Osteopenia 1977: Pneumonia No date: PONV (postoperative nausea and vomiting) No date: RBBB (right bundle branch block) No date: Sleep apnea     Comment:  CPAP last used in 2020 No date: Stage 3a chronic kidney disease (CKD) (HCC) No date: Type 2 diabetes mellitus without complication, without long- term current use of insulin  (HCC) No date: Vertigo No date: Vitamin B 12 deficiency  Reproductive/Obstetrics                             Anesthesia Physical Anesthesia Plan  ASA: 3  Anesthesia Plan: General   Post-op Pain Management: Tylenol  PO (pre-op)*, Celebrex  PO (pre-op)* and Gabapentin  PO (pre-op)*   Induction: Intravenous  PONV Risk Score and Plan: 4 or greater and Ondansetron , Dexamethasone  and Treatment may vary due to age or medical condition  Airway Management Planned: Oral ETT and Video Laryngoscope Planned  Additional Equipment: None  Intra-op Plan:   Post-operative Plan: Extubation in OR  Informed Consent: I have reviewed the patients History and Physical, chart, labs and discussed the procedure including the risks, benefits and alternatives for the proposed anesthesia with the patient or authorized representative who has indicated his/her understanding and acceptance.     Dental advisory given  Plan Discussed with: CRNA and Surgeon  Anesthesia Plan Comments: (Discussed risks of anesthesia with patient, including PONV, sore throat, lip/dental/eye damage. Rare risks discussed as well, such as cardiorespiratory and neurological sequelae, and allergic reactions. Discussed the role of CRNA in patient's perioperative care. Patient understands.)  Anesthesia Quick Evaluation

## 2023-04-14 NOTE — TOC Initial Note (Signed)
 Transition of Care [facility]) - Initial/Assessment Note    Patient Details  Name: Cindy Wolfe MRN: [id number] Date of Birth: Mar 17, 1946  Transition of [facility] CM/SW Contact:    Cindy JINNY Bernheim, RN Phone Number: 04/14/2023, 11:39 AM  Clinical Narrative:                  The patient has HTA for Ins and [facility] is not able to accept them, Sent the referral to [facility] at [place]    Awaiting a response to see if they can accept the patient   Patient Goals and CMS Choice            Expected Discharge Plan and Services                                              Prior Living Arrangements/Services                       Activities of Daily Living      Permission Sought/Granted                  Emotional Assessment              Admission diagnosis:  Loose orthopedic implant initial encounter CMS-HCC T84.039A Chronic pain of left knee M25.562, G89.29 Patient Active Problem List   Diagnosis Date Noted   Essential tremor 01/28/2023   BMI 45.0-49.9, adult (HCC) 02/19/2018   S/P total knee arthroplasty 09/25/2015   Hyperlipidemia, unspecified 05/20/2015   RBBB 05/02/2014   Acquired hypothyroidism 04/20/2014   Type 2 diabetes mellitus without complication (HCC) 04/20/2014   IBS (irritable bowel syndrome) 11/16/2013   Arthritis 09/25/2013   Benign essential hypertension 09/25/2013   Sleep apnea 09/04/2013   Chronic diarrhea 09/01/2013   PCP:  Glover Lenis, MD Pharmacy:   [facility] [address], [place] - [address] [place] [address] Phone: [phone] Fax: [phone]     Social Drivers of Health (SDOH) Social History: SDOH Screenings   Food Insecurity: No Food Insecurity (10/19/2022)   Received from [facility]  Housing: Low Risk  (04/08/2023)   Received from [facility]  Transportation Needs: No Transportation Needs (10/19/2022)   Received from  [facility]  Utilities: Not At Risk (10/19/2022)   Received from [facility]  Financial Resource Strain: Low Risk  (10/19/2022)   Received from [facility]  Tobacco Use: Medium Risk (04/14/2023)   SDOH Interventions:     Readmission Risk Interventions     No data to display

## 2023-04-14 NOTE — Progress Notes (Signed)
 PT Cancellation Note  Patient Details Name: Cindy Wolfe MRN: [id number] DOB: October 13, 1945   Cancelled Treatment:    Reason Eval/Treat Not Completed: Medical issues which prohibited therapy Spoke with PACU nurse, pt still very sleepy from general anesthesia around 16:30.  Not appropriate for PT this date, will eval tomorrow AM.    Carmin JONELLE Deed, DPT 04/14/2023, 5:09 PM

## 2023-04-14 NOTE — Progress Notes (Signed)
 Patient is not able to walk the distance required to go the bathroom, or he/she is unable to safely negotiate stairs required to access the bathroom.  A 3in1 BSC will alleviate this problem   Amenda Duclos P. Angie Fava M.D.

## 2023-04-14 NOTE — Progress Notes (Signed)
 Patient awake, lethargic. Verbalizes that surgery completed. Patient to move lower ext without event, received general anesthesia. Medicated as ordered for discomfort.  Xray completed. PT at bedside, patient to sleepy, will see in am. Tolerated po fluids, will order dinner and breakfast. Of note: generalized edema, +1 pedal area with doppler pulses.

## 2023-04-15 ENCOUNTER — Encounter: Payer: Self-pay | Admitting: Orthopedic Surgery

## 2023-04-15 LAB — GLUCOSE, CAPILLARY: Glucose-Capillary: 125 mg/dL — ABNORMAL HIGH (ref 70–99)

## 2023-04-15 MED ORDER — SENNOSIDES-DOCUSATE SODIUM 8.6-50 MG PO TABS
ORAL_TABLET | ORAL | Status: AC
  Filled 2023-04-15: qty 1

## 2023-04-15 MED ORDER — IRBESARTAN 150 MG PO TABS: 300.0000 mg | ORAL_TABLET | Freq: Every day | ORAL | Status: DC

## 2023-04-15 MED ORDER — TRAMADOL HCL 50 MG PO TABS: 50.0000 mg | ORAL_TABLET | ORAL | 0 refills | Status: AC | PRN

## 2023-04-15 MED ORDER — ASPIRIN 81 MG PO CHEW: 81.0000 mg | CHEWABLE_TABLET | Freq: Two times a day (BID) | ORAL | Status: AC

## 2023-04-15 MED ORDER — ACETAMINOPHEN 10 MG/ML IV SOLN
INTRAVENOUS | Status: AC
  Filled 2023-04-15: qty 100

## 2023-04-15 MED ORDER — CELECOXIB 200 MG PO CAPS: 200.0000 mg | ORAL_CAPSULE | Freq: Two times a day (BID) | ORAL | 1 refills | Status: AC

## 2023-04-15 MED ORDER — CELECOXIB 200 MG PO CAPS
ORAL_CAPSULE | ORAL | Status: AC
  Filled 2023-04-15: qty 1

## 2023-04-15 MED ORDER — OXYCODONE HCL 5 MG PO TABS: 5.0000 mg | ORAL_TABLET | ORAL | 0 refills | Status: AC | PRN

## 2023-04-15 MED ORDER — HYDROCHLOROTHIAZIDE 25 MG PO TABS
25.0000 mg | ORAL_TABLET | Freq: Every day | ORAL | Status: DC
Start: 2023-04-15 — End: 2023-04-15

## 2023-04-15 MED ORDER — ASPIRIN 81 MG PO CHEW
CHEWABLE_TABLET | ORAL | Status: AC
  Filled 2023-04-15: qty 1

## 2023-04-15 MED ORDER — METFORMIN HCL 500 MG PO TABS
ORAL_TABLET | ORAL | Status: AC
  Filled 2023-04-15: qty 1

## 2023-04-15 MED ORDER — INSULIN ASPART 100 UNIT/ML IJ SOLN
INTRAMUSCULAR | Status: AC
  Filled 2023-04-15: qty 1

## 2023-04-15 MED ORDER — FERROUS SULFATE 325 (65 FE) MG PO TABS
ORAL_TABLET | ORAL | Status: AC
  Filled 2023-04-15: qty 1

## 2023-04-15 MED ORDER — METOCLOPRAMIDE HCL 10 MG PO TABS
ORAL_TABLET | ORAL | Status: AC
  Filled 2023-04-15: qty 1

## 2023-04-15 MED ORDER — CELECOXIB 200 MG PO CAPS: 200.0000 mg | ORAL_CAPSULE | Freq: Two times a day (BID) | ORAL | 1 refills | Status: DC

## 2023-04-15 MED ORDER — PANTOPRAZOLE SODIUM 40 MG PO TBEC
DELAYED_RELEASE_TABLET | ORAL | Status: AC
  Filled 2023-04-15: qty 1

## 2023-04-15 NOTE — Plan of Care (Signed)

## 2023-04-15 NOTE — Plan of Care (Signed)
  Problem: Activity: Goal: Risk for activity intolerance will decrease Outcome: Progressing   Problem: Coping: Goal: Level of anxiety will decrease Outcome: Progressing   Problem: Elimination: Goal: Will not experience complications related to urinary retention Outcome: Progressing   Problem: Pain Management: Goal: Pain level will decrease with appropriate interventions Outcome: Progressing

## 2023-04-15 NOTE — Progress Notes (Signed)
 Pt discharged with dc paperwork reviewed and voices no question or concerns at this time. Two honeycomb dressing as well as mediation scripts given to pt. Pt has both TED hose on and in place. Pt wheeled down to medical mall entrance by staff and pt's husband provided transportation.

## 2023-04-15 NOTE — Evaluation (Signed)
 Physical Therapy Evaluation Patient Details Name: Cindy Wolfe MRN: [id number] DOB: Dec 30, 1945 Today's Date: 04/15/2023  History of Present Illness  Pt is a 78 year old female s/p L total knee revision 02/12/23  Clinical Impression  Pt pleasant and eager to work with PT.  She did very well meeting or exceeding typically POD1 metrics; walking ~200 ft, negotiating up/down steps, performing SLRs and with 0-93 ROM.  Pt with minimal pain, stable vitals and good overall safety t/o the session.  Pt will benefit from continued PT per TKA protocols.        If plan is discharge home, recommend the following: A little help with walking and/or transfers;A little help with bathing/dressing/bathroom;Assistance with cooking/housework;Assist for transportation;Help with stairs or ramp for entrance   Can travel by private vehicle        Equipment Recommendations Rolling walker (2 wheels)  Recommendations for Other Services       Functional Status Assessment Patient has had a recent decline in their functional status and demonstrates the ability to make significant improvements in function in a reasonable and predictable amount of time.     Precautions / Restrictions Precautions Precautions: Fall;Knee Restrictions Weight Bearing Restrictions Per Provider Order: Yes LLE Weight Bearing Per Provider Order: Weight bearing as tolerated      Mobility  Bed Mobility Overal bed mobility: Modified Independent                  Transfers Overall transfer level: Modified independent Equipment used: Rolling walker (2 wheels) (able to rise from multiple surfaces multiple times w/o assist, minimal cuing for set up)                    Ambulation/Gait Ambulation/Gait assistance: Contact guard assist Gait Distance (Feet): 200 Feet Assistive device: Rolling walker (2 wheels)         General Gait Details: Pt with some initial hesitancy, re-adjusted walker height with good results  and improved cadence and confidence.  Pt overall did well and had no buckling or safety issues.  Good effort, with only minimal c/o fatigue.  Stairs Stairs: Yes Stairs assistance: Supervision Stair Management: Two rails, Step to pattern Number of Stairs: 4 General stair comments: Pt recalled appropriate sequencing and was able to negotiate up/down steps w/o issue or need for assist  Wheelchair Mobility     Tilt Bed    Modified Rankin (Stroke Patients Only)       Balance Overall balance assessment: Modified Independent (expected reliance on walker)                                           Pertinent Vitals/Pain Pain Assessment Pain Assessment: 0-10 Pain Score: 1  Pain Location: L knee    Home Living Family/patient expects to be discharged to:: Private residence Living Arrangements: Spouse/significant other Available Help at Discharge: Family Type of Home: House Home Access: Stairs to enter Entrance Stairs-Rails: Can reach both Entrance Stairs-Number of Steps: 3   Home Layout: One level Home Equipment: [employer] (2 wheels);BSC/3in1;Shower seat Additional Comments: reacher    Prior Function Prior Level of Function : Independent/Modified Independent             Mobility Comments: amb with RW home/community distances ADLs Comments: generally MOD I in ADL, has essential tremor that pt reports can make feeling herself difficult; PRN assist for IADLs  Extremity/Trunk Assessment   Upper Extremity Assessment Upper Extremity Assessment: RUE deficits/detail;LUE deficits/detail RUE Deficits / Details: baseline tremor LUE Deficits / Details: baseline tremor    Lower Extremity Assessment Lower Extremity Assessment: LLE deficits/detail LLE Deficits / Details: s/p L total knee revision       Communication   Communication Communication: No apparent difficulties Cueing Techniques: Verbal cues;Visual cues  Cognition Arousal:  Alert Behavior During Therapy: WFL for tasks assessed/performed Overall Cognitive Status: Within Functional Limits for tasks assessed                                          General Comments General comments (skin integrity, edema, etc.): spo2 >90% onRA  pre/post mobility    Exercises Total Joint Exercises Ankle Circles/Pumps: AROM, 10 reps Quad Sets: Strengthening, 10 reps Short Arc Quad: AROM, 10 reps Heel Slides: Strengthening, 10 reps (with resisted leg ext) Hip ABduction/ADduction: Strengthening, 10 reps Straight Leg Raises: AROM, 10 reps Knee Flexion: AAROM, 5 reps Goniometric ROM: 0-93 (AROM to 86)   Assessment/Plan    PT Assessment Patient needs continued PT services  PT Problem List Decreased strength;Decreased range of motion;Decreased activity tolerance;Decreased balance;Decreased mobility;Decreased safety awareness;Pain;Cardiopulmonary status limiting activity       PT Treatment Interventions DME instruction;Gait training;Stair training;Functional mobility training;Therapeutic activities;Therapeutic exercise;Balance training;Patient/family education    PT Goals (Current goals can be found in the Care Plan section)  Acute Rehab PT Goals Patient Stated Goal: Go home PT Goal Formulation: With patient/family Time For Goal Achievement: 04/28/23 Potential to Achieve Goals: Good    Frequency BID     Co-evaluation               AM-PAC PT 6 Clicks Mobility  Outcome Measure Help needed turning from your back to your side while in a flat bed without using bedrails?: None Help needed moving from lying on your back to sitting on the side of a flat bed without using bedrails?: None Help needed moving to and from a bed to a chair (including a wheelchair)?: None Help needed standing up from a chair using your arms (e.g., wheelchair or bedside chair)?: None Help needed to walk in hospital room?: A Little Help needed climbing 3-5 steps with a  railing? : A Little 6 Click Score: 22    End of Session Equipment Utilized During Treatment: Gait belt Activity Tolerance: Patient tolerated treatment well Patient left: in chair;with call bell/phone within reach;with family/visitor present Nurse Communication: Mobility status PT Visit Diagnosis: Muscle weakness (generalized) (M62.81);Difficulty in walking, not elsewhere classified (R26.2);Pain Pain - Right/Left: Left Pain - part of body: Knee    Time: 9151-9067 PT Time Calculation (min) (ACUTE ONLY): 44 min   Charges:   PT Evaluation $PT Eval Low Complexity: 1 Low PT Treatments $Gait Training: 8-22 mins $Therapeutic Exercise: 8-22 mins PT General Charges $$ ACUTE PT VISIT: 1 Visit         Carmin JONELLE Deed, DPT 04/15/2023, 10:49 AM

## 2023-04-15 NOTE — Discharge Summary (Signed)
 Physician Discharge Summary  Subjective: 1 Day Post-Op Procedure(s) (LRB): TOTAL KNEE REVISION (Left) Patient reports pain as mild.   Patient seen in rounds with Dr. Mardee. Patient is well, and has had no acute complaints or problems.  Denies any CP, SOB, N/B, fevers or chills We will start therapy today.  Patient is ready to go home  Physician Discharge Summary  Patient ID: Cindy Wolfe MRN: [id number] DOB/AGE: 08-01-1945 78 y.o.  Admit date: 04/14/2023 Discharge date: 04/15/2023  Admission Diagnoses:  Discharge Diagnoses:  Principal Problem:   S/P revision of total knee   Discharged Condition: good  Hospital Course: Patient presented to the hospital on 04/14/2023 for an elective left total knee revision performed by Dr. Mardee. Patient was given 1g of TXA and 3g of Ancef  prior to the procedure. she tolerated the procedure well without any complications. See procedural note below for details. Postoperatively, the patient did very well. she was able to pass PT protocols on post-op day one without any issues. JP drain was removed without any difficulty and was intact. she was able to void her bladder without any difficulty. Physical exam was unremarkable. she denies any SOB, CP, N/V, fevers or chills. Vital signs are stable. Patient is stable to discharge home.  PROCEDURE:  Left knee revision arthroplasty (tibial component)   SURGEON:  Lynwood SHAUNNA Mardee Mickey. M.D.   ASSISTANT:  Sidra Koyanagi, PA-C (present and scrubbed throughout the case, critical for assistance with exposure, retraction, instrumentation, and closure)   ANESTHESIA: general   ESTIMATED BLOOD LOSS: 50 mL   FLUIDS REPLACED: 700 mL of crystalloid   TOURNIQUET TIME: 131 minutes   DRAINS: 2 medium Hemovac drains   IMPLANTS UTILIZED: DePuy size 4  15 mm MBT revision tibial component (cemented), 28 mm MBT revision metaphyseal sleeve (porous), 75 mm x 12 mm universal fluted stem, and a 12.5 mm stabilized rotating  platform polyethylene insert.    Treatments: none  Discharge Exam: Blood pressure (!) 110/58, pulse 66, temperature 97.7 F (36.5 C), temperature source Oral, resp. rate 16, height 5' 3 (1.6 m), weight 126.1 kg, SpO2 92%.   Disposition: home   Allergies as of 04/15/2023       Reactions   Clindamycin  Nausea Only   Codeine    Lidocaine  Other (See Comments)   IV FORM ONLY- Hypotension   Prednisone    Causes sugar to spike   Tramadol     Zetia [ezetimibe]    AND STATINS   Adhesive [tape] Itching, Rash   band aides cause skin reaction. Paper tape ok.   Neomycin -bacitracin Zn-polymyx Itching, Rash   Other Rash   INCLUDING STERI-STRIPS; SEASONAL ALLERGIES   Penicillins Hives, Rash   IgE = 429 (WNL) on 04/09/2023 Has patient had a PCN reaction causing immediate rash, facial/tongue/throat swelling, SOB or lightheadedness with hypotension: Yes Has patient had a PCN reaction causing severe rash involving mucus membranes or skin necrosis: No Has patient had a PCN reaction occurring within the last 10 years: No 04/14/2023 Pt tolerated Ancef  3G, IV given prior to surgery without any reaction. Lidocaine  was also given without incident.   Sulfa Antibiotics Hives, Rash        Medication List     PAUSE taking these medications    nitrofurantoin (macrocrystal-monohydrate) 100 MG capsule Wait to take this until: April 16, 2023 Commonly known as: MACROBID Take 100 mg by mouth 2 (two) times daily. Ask about: Should I take this medication?       TAKE  these medications    acetaminophen  500 MG tablet Commonly known as: TYLENOL  Take 1,000 mg by mouth 2 (two) times daily.   Align 4 MG Caps Take 4 mg by mouth daily with breakfast.   amLODipine  5 MG tablet Commonly known as: NORVASC  Take 5 mg by mouth daily.   ascorbic acid 500 MG tablet Commonly known as: VITAMIN C Take 500 mg by mouth daily.   aspirin  81 MG chewable tablet Chew 1 tablet (81 mg total) by mouth 2 (two) times  daily.   Calcium  + Vitamin D3 600-10 MG-MCG Tabs Generic drug: Calcium  Carb-Cholecalciferol  Take 1 tablet by mouth daily.   calcium  carbonate 500 MG chewable tablet Commonly known as: TUMS - dosed in mg elemental calcium  Chew 2 tablets by mouth as needed for indigestion or heartburn.   celecoxib  200 MG capsule Commonly known as: CELEBREX  Take 1 capsule (200 mg total) by mouth 2 (two) times daily.   cholecalciferol  1000 units tablet Commonly known as: VITAMIN D  Take 1,000 Units by mouth daily with supper.   clotrimazole -betamethasone cream Commonly known as: LOTRISONE Apply 1 application topically as needed (ears).   diphenhydrAMINE  25 MG tablet Commonly known as: BENADRYL  Take 25 mg by mouth every 6 (six) hours as needed for itching or allergies.   hydrocortisone  cream 1 % Apply 1 application topically as needed for itching.   hydroxypropyl methylcellulose / hypromellose 2.5 % ophthalmic solution Commonly known as: ISOPTO TEARS / GONIOVISC Place 1 drop into both eyes as needed for dry eyes.   Imodium  A-D 1 MG/7.5ML Liqd Generic drug: Loperamide  HCl Take 1 mg by mouth as needed (diarrhea).   levothyroxine  150 MCG tablet Commonly known as: SYNTHROID  Take 150 mcg by mouth daily before breakfast.   loratadine  10 MG tablet Commonly known as: CLARITIN  Take 10 mg by mouth daily as needed for allergies.   meclizine  25 MG tablet Commonly known as: ANTIVERT  Take 25 mg by mouth 3 (three) times daily as needed for dizziness.   metFORMIN  500 MG tablet Commonly known as: GLUCOPHAGE  Take 500 mg by mouth 2 (two) times daily with a meal.   multivitamins with iron Tabs tablet Take 1 tablet by mouth daily.   oxyCODONE  5 MG immediate release tablet Commonly known as: Oxy IR/ROXICODONE  Take 1 tablet (5 mg total) by mouth every 4 (four) hours as needed for moderate pain (pain score 4-6) (pain score 4-6).   pioglitazone  30 MG tablet Commonly known as: ACTOS  Take 30 mg by mouth  daily.   propranolol  40 MG tablet Commonly known as: INDERAL  Take 80 mg by mouth 2 (two) times daily.   simethicone 125 MG chewable tablet Commonly known as: MYLICON Chew 125 mg by mouth every 6 (six) hours as needed for flatulence.   telmisartan -hydrochlorothiazide  80-25 MG tablet Commonly known as: MICARDIS  HCT Take 1 tablet by mouth daily.   traMADol  50 MG tablet Commonly known as: ULTRAM  Take 1-2 tablets (50-100 mg total) by mouth every 4 (four) hours as needed for moderate pain (pain score 4-6).   vitamin B-12 500 MCG tablet Commonly known as: CYANOCOBALAMIN  Take 500 mcg by mouth daily.               Durable Medical Equipment  (From admission, onward)           Start     Ordered   04/14/23 1605  DME Walker rolling  Once       Question:  Patient needs a walker to treat with the following  condition  Answer:  Total knee replacement status   04/14/23 1604   04/14/23 1605  DME Bedside commode  Once       Comments: Patient is not able to walk the distance required to go the bathroom, or he/she is unable to safely negotiate stairs required to access the bathroom.  A 3in1 BSC will alleviate this problem  Question:  Patient needs a bedside commode to treat with the following condition  Answer:  Total knee replacement status   04/14/23 1604            Follow-up Information     Drake Chew, PA-C Follow up on 04/29/2023.   Specialty: Orthopedic Surgery Why: at 1:45pm Contact information: [address] [place] [address] [phone]         Mardee Lynwood SQUIBB, MD Follow up on 05/27/2023.   Specialty: Orthopedic Surgery Why: at 10:30am Contact information: [address] [facility] [place] [address] [phone]                 Signed: Sidra Drake 04/15/2023, 8:23 AM   Objective: Vital signs in last 24 hours: Temp:  [97.3 F (36.3 C)-98.1 F (36.7 C)] 97.7 F (36.5 C) (01/09 0747) Pulse Rate:  [56-75]  66 (01/09 0747) Resp:  [11-20] 16 (01/09 0747) BP: (108-173)/(50-74) 110/58 (01/09 0747) SpO2:  [84 %-100 %] 92 % (01/09 0747) Weight:  [126.1 kg] 126.1 kg (01/08 1001)  Intake/Output from previous day:  Intake/Output Summary (Last 24 hours) at 04/15/2023 0823 Last data filed at 04/15/2023 0516 Gross per 24 hour  Intake 1710 ml  Output 342 ml  Net 1368 ml    Intake/Output this shift: No intake/output data recorded.  Labs: No results for input(s): HGB in the last 72 hours. No results for input(s): WBC, RBC, HCT, PLT in the last 72 hours. No results for input(s): NA, K, CL, CO2, BUN, CREATININE, GLUCOSE, CALCIUM  in the last 72 hours. No results for input(s): LABPT, INR in the last 72 hours.  EXAM: General - Patient is Alert, Appropriate, and Oriented Extremity - Neurologically intact ABD soft Neurovascular intact Sensation intact distally Intact pulses distally Dorsiflexion/Plantar flexion intact No cellulitis present Compartment soft Dressing - dressing C/D/I and no drainage Motor Function - intact, moving foot and toes well on exam.  Able to plantar and dorsiflex with good strength and ROM.  Vascular intact all dermatomes on her left lower extremity.  Posterior tibial pulses appreciated. JP Drain pulled without difficulty. Intact   Assessment/Plan: 1 Day Post-Op Procedure(s) (LRB): TOTAL KNEE REVISION (Left) Procedure(s) (LRB): TOTAL KNEE REVISION (Left) Past Medical History:  Diagnosis Date   Anemia    Arthritis    Complication of anesthesia    1 time I had a hard time waking up.   Diverticulosis    DJD (degenerative joint disease)    Family history of adverse reaction to anesthesia    daughter post op nausea and vomiting    GERD (gastroesophageal reflux disease)    Hemorrhoids    History of abnormal mammogram 09/28/2012   BIRADS 2 AT [facility]   History of bone density study 2012   C PCP; WNL   History of Papanicolaou smear of cervix  07/03/10; 10/02/13   -/-; -/-   Hypercholesterolemia    CAN'T TAKE MEDS   Hyperlipidemia    Hypertension, essential    Hypothyroidism    IBS (irritable bowel syndrome)    Migraine    Osteopenia  Pneumonia 1977   PONV (postoperative nausea and vomiting)    RBBB (right bundle branch block)    Sleep apnea    CPAP last used in 2020   Stage 3a chronic kidney disease (CKD) (HCC)    Type 2 diabetes mellitus without complication, without long-term current use of insulin  (HCC)    Vertigo    Vitamin B 12 deficiency    Principal Problem:   S/P revision of total knee  Estimated body mass index is 49.25 kg/m as calculated from the following:   Height as of this encounter: 5' 3 (1.6 m).   Weight as of this encounter: 126.1 kg.  Patient will continue to work with physical therapy to pass postoperative PT protocols, ROM and strengthening   Discussed with the patient continuing to utilize [employer]   Patient will use bone foam in 20-30 minute intervals   Patient will wear TED hose bilaterally to help prevent DVT and clot formation   Discussed the Aquacel bandage.  This bandage will stay in place 7 days postoperatively.  Can be replaced with honeycomb bandages that will be sent home with the patient   Discussed sending the patient home with tramadol  and oxycodone  for as needed pain management.  Patient will also be sent home with Celebrex  to help with swelling and inflammation.  Patient will take an 81 mg aspirin  twice daily for DVT prophylaxis   JP drain removed without difficulty, intact   Weight-Bearing as tolerated to left leg   Patient will follow-up with [facility] orthopedics in 2 weeks for staple removal and reevaluation    Diet - Regular diet Follow up - in 2 weeks Activity - WBAT Disposition - Home Condition Upon Discharge - Good DVT Prophylaxis - Aspirin  and TED hose  Fonda CHARLENA Koyanagi, PA-C Orthopaedic Surgery 04/15/2023, 8:23 AM

## 2023-04-15 NOTE — Evaluation (Addendum)
 Occupational Therapy Evaluation Patient Details Name: Cindy Wolfe MRN: [id number] DOB: 1945-11-09 Today's Date: 04/15/2023   History of Present Illness Pt is a 78 year old female s/p L total knee revision 02/12/23   Clinical Impression   Pt seen for OT evaluation this date, POD#1 from above surgery.PTA pt is generally MOD I in ADL, prn assist for IADL, amb with RW. Pt and husband provided education re:  polar care mgt, falls prevention strategies, home/routines modifications, DME/AE for LB bathing and dressing tasks, and compression stocking mgt. Good carry over noted throughout. Hand out provided. Pt would benefit from skilled OT services including additional instruction in dressing techniques with or without assistive devices for dressing and bathing skills to support recall and carryover prior to discharge and ultimately to maximize safety, independence, and minimize falls risk and caregiver burden.     If plan is discharge home, recommend the following: Help with stairs or ramp for entrance;Assistance with cooking/housework;A little help with bathing/dressing/bathroom    Functional Status Assessment  Patient has had a recent decline in their functional status and demonstrates the ability to make significant improvements in function in a reasonable and predictable amount of time.  Equipment Recommendations  None recommended by OT;Other (comment) (pt has recommended equipment)    Recommendations for Other Services       Precautions / Restrictions Precautions Precautions: Fall;Knee Restrictions Weight Bearing Restrictions Per Provider Order: Yes LLE Weight Bearing Per Provider Order: Weight bearing as tolerated      Mobility Bed Mobility Overal bed mobility: Needs Assistance             General bed mobility comments: NT in recliner pre/post session    Transfers Overall transfer level: Needs assistance Equipment used: Rolling walker (2 wheels) Transfers: Sit  to/from Stand Sit to Stand: Contact guard assist                  Balance Overall balance assessment: Needs assistance Sitting-balance support: Feet supported Sitting balance-Leahy Scale: Good     Standing balance support: Bilateral upper extremity supported, During functional activity Standing balance-Leahy Scale: Good                             ADL either performed or assessed with clinical judgement   ADL Overall ADL's : Needs assistance/impaired Eating/Feeding: Set up;Sitting   Grooming: Set up;Sitting           Upper Body Dressing : Set up;Sitting   Lower Body Dressing: Supervision/safety;Sitting/lateral leans Lower Body Dressing Details (indicate cue type and reason): intermittent vcs for technique, prn use of AE Toilet Transfer: Supervision/safety;Rolling walker (2 wheels);BSC/3in1;Grab bars   Toileting- Clothing Manipulation and Hygiene: Supervision/safety;Sitting/lateral lean Toileting - Clothing Manipulation Details (indicate cue type and reason): anticipate     Functional mobility during ADLs: Supervision/safety;Contact guard assist;Rolling walker (2 wheels) (approx 10' two attempts with RW)       Vision Baseline Vision/History: 1 Wears glasses Patient Visual Report: No change from baseline       Perception         Praxis         Pertinent Vitals/Pain Pain Assessment Pain Assessment: Faces Faces Pain Scale: Hurts a little bit Pain Location: L knee Pain Descriptors / Indicators: Discomfort Pain Intervention(s): Limited activity within patient's tolerance, Monitored during session, Repositioned     Extremity/Trunk Assessment Upper Extremity Assessment Upper Extremity Assessment: RUE deficits/detail;LUE deficits/detail RUE Deficits / Details: baseline tremor  LUE Deficits / Details: baseline tremor   Lower Extremity Assessment Lower Extremity Assessment: LLE deficits/detail LLE Deficits / Details: s/p L total knee  revision       Communication Communication Communication: No apparent difficulties Cueing Techniques: Verbal cues;Visual cues   Cognition Arousal: Alert Behavior During Therapy: WFL for tasks assessed/performed Overall Cognitive Status: Within Functional Limits for tasks assessed                                       General Comments  spo2 >90% onRA  pre/post mobility    Exercises     Shoulder Instructions      Home Living Family/patient expects to be discharged to:: Private residence Living Arrangements: Spouse/significant other Available Help at Discharge: Family Type of Home: House Home Access: Stairs to enter Secretary/administrator of Steps: 3 Entrance Stairs-Rails: Can reach both Home Layout: One level     Bathroom Shower/Tub: Producer, Television/film/video: Standard     Home Equipment: Agricultural Consultant (2 wheels);BSC/3in1;Shower seat   Additional Comments: reacher      Prior Functioning/Environment Prior Level of Function : Independent/Modified Independent             Mobility Comments: amb with RW home/community distances ADLs Comments: generally MOD I in ADL, has essential tremor that pt reports can make feeling herself difficult; PRN assist for IADLs        OT Problem List: Decreased activity tolerance;Impaired balance (sitting and/or standing);Decreased knowledge of use of DME or AE      OT Treatment/Interventions: Self-care/ADL training;DME and/or AE instruction;Therapeutic activities;Balance training;Therapeutic exercise;Patient/family education    OT Goals(Current goals can be found in the care plan section) Acute Rehab OT Goals Patient Stated Goal: improve funciton OT Goal Formulation: With patient Time For Goal Achievement: 04/29/23 Potential to Achieve Goals: Good ADL Goals Pt Will Perform Grooming: with modified independence;sitting;standing Pt Will Perform Lower Body Dressing: with modified independence;sit to/from  stand;sitting/lateral leans Pt Will Transfer to Toilet: with modified independence;ambulating Pt Will Perform Toileting - Clothing Manipulation and hygiene: with modified independence;sit to/from stand  OT Frequency: Min 1X/week    Co-evaluation              AM-PAC OT 6 Clicks Daily Activity     Outcome Measure Help from another person eating meals?: None Help from another person taking care of personal grooming?: None Help from another person toileting, which includes using toliet, bedpan, or urinal?: None Help from another person bathing (including washing, rinsing, drying)?: A Little Help from another person to put on and taking off regular upper body clothing?: None Help from another person to put on and taking off regular lower body clothing?: None 6 Click Score: 23   End of Session Equipment Utilized During Treatment: Rolling walker (2 wheels) Nurse Communication: Mobility status  Activity Tolerance: Patient tolerated treatment well Patient left: in chair;with call bell/phone within reach;with family/visitor present  OT Visit Diagnosis: Other abnormalities of gait and mobility (R26.89);Unsteadiness on feet (R26.81)                Time: 9045-8980 OT Time Calculation (min): 25 min Charges:  OT General Charges $OT Visit: 1 Visit OT Evaluation $OT Eval Low Complexity: 1 Low  Therisa Sheffield, OTD OTR/L  04/15/23, 10:49 AM

## 2023-04-15 NOTE — Progress Notes (Signed)
 Subjective: 1 Day Post-Op Procedure(s) (LRB): TOTAL KNEE REVISION (Left) Patient reports pain as mild.   Patient seen in rounds with Dr. Mardee. Patient is well, and has had no acute complaints or problems.  Denies any CP, SOB, N/B, fevers or chills We will start therapy today.  Plan is to go Home after hospital stay.  Objective: Vital signs in last 24 hours: Temp:  [97.3 F (36.3 C)-98.1 F (36.7 C)] 97.7 F (36.5 C) (01/09 0747) Pulse Rate:  [56-75] 66 (01/09 0747) Resp:  [11-20] 16 (01/09 0747) BP: (108-173)/(50-74) 110/58 (01/09 0747) SpO2:  [84 %-100 %] 92 % (01/09 0747) Weight:  [126.1 kg] 126.1 kg (01/08 1001)  Intake/Output from previous day:  Intake/Output Summary (Last 24 hours) at 04/15/2023 0751 Last data filed at 04/15/2023 0516 Gross per 24 hour  Intake 1710 ml  Output 342 ml  Net 1368 ml    Intake/Output this shift: No intake/output data recorded.  Labs: No results for input(s): HGB in the last 72 hours. No results for input(s): WBC, RBC, HCT, PLT in the last 72 hours. No results for input(s): NA, K, CL, CO2, BUN, CREATININE, GLUCOSE, CALCIUM  in the last 72 hours. No results for input(s): LABPT, INR in the last 72 hours.  EXAM General - Patient is Alert, Appropriate, and Oriented Extremity - Neurologically intact ABD soft Neurovascular intact Sensation intact distally Intact pulses distally Dorsiflexion/Plantar flexion intact No cellulitis present Compartment soft Dressing - dressing C/D/I and no drainage Motor Function - intact, moving foot and toes well on exam.  Able to plantar and dorsiflex with good strength and ROM.  Vascular intact all dermatomes on her left lower extremity.  Posterior tibial pulses appreciated. JP Drain pulled without difficulty. Intact  Past Medical History:  Diagnosis Date   Anemia    Arthritis    Complication of anesthesia    1 time I had a hard time waking up.   Diverticulosis    DJD  (degenerative joint disease)    Family history of adverse reaction to anesthesia    daughter post op nausea and vomiting    GERD (gastroesophageal reflux disease)    Hemorrhoids    History of abnormal mammogram 09/28/2012   BIRADS 2 AT [facility]   History of bone density study 2012   C PCP; WNL   History of Papanicolaou smear of cervix 07/03/10; 10/02/13   -/-; -/-   Hypercholesterolemia    CAN'T TAKE MEDS   Hyperlipidemia    Hypertension, essential    Hypothyroidism    IBS (irritable bowel syndrome)    Migraine    Osteopenia    Pneumonia 1977   PONV (postoperative nausea and vomiting)    RBBB (right bundle branch block)    Sleep apnea    CPAP last used in 2020   Stage 3a chronic kidney disease (CKD) (HCC)    Type 2 diabetes mellitus without complication, without long-term current use of insulin  (HCC)    Vertigo    Vitamin B 12 deficiency     Assessment/Plan: 1 Day Post-Op Procedure(s) (LRB): TOTAL KNEE REVISION (Left) Principal Problem:   S/P revision of total knee  Estimated body mass index is 49.25 kg/m as calculated from the following:   Height as of this encounter: 5' 3 (1.6 m).   Weight as of this encounter: 126.1 kg. Advance diet Up with therapy  Patient will continue to work with physical therapy to pass postoperative PT protocols, ROM and strengthening  Discussed with the patient continuing  to utilize Polar Care  Patient will use bone foam in 20-30 minute intervals  Patient will wear TED hose bilaterally to help prevent DVT and clot formation  Discussed the Aquacel bandage.  This bandage will stay in place 7 days postoperatively.  Can be replaced with honeycomb bandages that will be sent home with the patient  Discussed sending the patient home with tramadol  and oxycodone  for as needed pain management.  Patient will also be sent home with Celebrex  to help with swelling and inflammation.  Patient will take an 81 mg aspirin  twice daily for DVT prophylaxis  JP  drain removed without difficulty, intact  Weight-Bearing as tolerated to left leg  Patient will follow-up with [facility] in 2 weeks for staple removal and reevaluation  Fonda Koyanagi, PA-C [facility] 04/15/2023, 7:51 AM \

## 2023-04-16 DIAGNOSIS — E1122 Type 2 diabetes mellitus with diabetic chronic kidney disease: Secondary | ICD-10-CM | POA: Diagnosis not present

## 2023-04-16 DIAGNOSIS — Z471 Aftercare following joint replacement surgery: Secondary | ICD-10-CM | POA: Diagnosis not present

## 2023-04-16 DIAGNOSIS — Z7984 Long term (current) use of oral hypoglycemic drugs: Secondary | ICD-10-CM | POA: Diagnosis not present

## 2023-04-16 DIAGNOSIS — N1831 Chronic kidney disease, stage 3a: Secondary | ICD-10-CM | POA: Diagnosis not present

## 2023-04-16 DIAGNOSIS — Z96652 Presence of left artificial knee joint: Secondary | ICD-10-CM | POA: Diagnosis not present

## 2023-04-16 DIAGNOSIS — K589 Irritable bowel syndrome without diarrhea: Secondary | ICD-10-CM | POA: Diagnosis not present

## 2023-04-16 DIAGNOSIS — I129 Hypertensive chronic kidney disease with stage 1 through stage 4 chronic kidney disease, or unspecified chronic kidney disease: Secondary | ICD-10-CM | POA: Diagnosis not present

## 2023-04-16 DIAGNOSIS — Z7982 Long term (current) use of aspirin: Secondary | ICD-10-CM | POA: Diagnosis not present

## 2023-04-16 DIAGNOSIS — E039 Hypothyroidism, unspecified: Secondary | ICD-10-CM | POA: Diagnosis not present

## 2023-04-16 DIAGNOSIS — E7800 Pure hypercholesterolemia, unspecified: Secondary | ICD-10-CM | POA: Diagnosis not present

## 2023-04-25 DIAGNOSIS — Z471 Aftercare following joint replacement surgery: Secondary | ICD-10-CM | POA: Diagnosis not present

## 2023-04-29 DIAGNOSIS — M25562 Pain in left knee: Secondary | ICD-10-CM | POA: Diagnosis not present

## 2023-04-29 DIAGNOSIS — M25462 Effusion, left knee: Secondary | ICD-10-CM | POA: Diagnosis not present

## 2023-05-04 DIAGNOSIS — M25562 Pain in left knee: Secondary | ICD-10-CM | POA: Diagnosis not present

## 2023-05-04 DIAGNOSIS — M25462 Effusion, left knee: Secondary | ICD-10-CM | POA: Diagnosis not present

## 2023-05-11 DIAGNOSIS — M25462 Effusion, left knee: Secondary | ICD-10-CM | POA: Diagnosis not present

## 2023-05-11 DIAGNOSIS — M25562 Pain in left knee: Secondary | ICD-10-CM | POA: Diagnosis not present

## 2023-05-13 DIAGNOSIS — M25462 Effusion, left knee: Secondary | ICD-10-CM | POA: Diagnosis not present

## 2023-05-13 DIAGNOSIS — M25562 Pain in left knee: Secondary | ICD-10-CM | POA: Diagnosis not present

## 2023-05-20 DIAGNOSIS — M25562 Pain in left knee: Secondary | ICD-10-CM | POA: Diagnosis not present

## 2023-05-20 DIAGNOSIS — M25462 Effusion, left knee: Secondary | ICD-10-CM | POA: Diagnosis not present

## 2023-05-28 DIAGNOSIS — M25562 Pain in left knee: Secondary | ICD-10-CM | POA: Diagnosis not present

## 2023-05-28 DIAGNOSIS — M25462 Effusion, left knee: Secondary | ICD-10-CM | POA: Diagnosis not present

## 2023-06-04 DIAGNOSIS — Z96652 Presence of left artificial knee joint: Secondary | ICD-10-CM | POA: Diagnosis not present

## 2023-06-08 DIAGNOSIS — I10 Essential (primary) hypertension: Secondary | ICD-10-CM | POA: Diagnosis not present

## 2023-06-08 DIAGNOSIS — E785 Hyperlipidemia, unspecified: Secondary | ICD-10-CM | POA: Diagnosis not present

## 2023-06-08 DIAGNOSIS — E119 Type 2 diabetes mellitus without complications: Secondary | ICD-10-CM | POA: Diagnosis not present

## 2023-06-08 DIAGNOSIS — E039 Hypothyroidism, unspecified: Secondary | ICD-10-CM | POA: Diagnosis not present

## 2023-06-08 DIAGNOSIS — N1831 Chronic kidney disease, stage 3a: Secondary | ICD-10-CM | POA: Diagnosis not present

## 2023-06-08 DIAGNOSIS — Z6841 Body Mass Index (BMI) 40.0 and over, adult: Secondary | ICD-10-CM | POA: Diagnosis not present

## 2023-07-09 DIAGNOSIS — E119 Type 2 diabetes mellitus without complications: Secondary | ICD-10-CM | POA: Diagnosis not present

## 2023-07-09 DIAGNOSIS — E785 Hyperlipidemia, unspecified: Secondary | ICD-10-CM | POA: Diagnosis not present

## 2023-07-09 DIAGNOSIS — I10 Essential (primary) hypertension: Secondary | ICD-10-CM | POA: Diagnosis not present

## 2023-07-09 DIAGNOSIS — E039 Hypothyroidism, unspecified: Secondary | ICD-10-CM | POA: Diagnosis not present

## 2023-08-09 DIAGNOSIS — E119 Type 2 diabetes mellitus without complications: Secondary | ICD-10-CM | POA: Diagnosis not present

## 2023-08-09 DIAGNOSIS — H43813 Vitreous degeneration, bilateral: Secondary | ICD-10-CM | POA: Diagnosis not present

## 2023-08-09 DIAGNOSIS — H2513 Age-related nuclear cataract, bilateral: Secondary | ICD-10-CM | POA: Diagnosis not present

## 2023-08-09 DIAGNOSIS — H35039 Hypertensive retinopathy, unspecified eye: Secondary | ICD-10-CM | POA: Diagnosis not present

## 2023-09-15 DIAGNOSIS — E039 Hypothyroidism, unspecified: Secondary | ICD-10-CM | POA: Diagnosis not present

## 2023-09-22 DIAGNOSIS — L989 Disorder of the skin and subcutaneous tissue, unspecified: Secondary | ICD-10-CM | POA: Diagnosis not present

## 2023-09-28 DIAGNOSIS — L570 Actinic keratosis: Secondary | ICD-10-CM | POA: Diagnosis not present

## 2023-09-28 DIAGNOSIS — D2261 Melanocytic nevi of right upper limb, including shoulder: Secondary | ICD-10-CM | POA: Diagnosis not present

## 2023-09-28 DIAGNOSIS — D2271 Melanocytic nevi of right lower limb, including hip: Secondary | ICD-10-CM | POA: Diagnosis not present

## 2023-09-28 DIAGNOSIS — D0471 Carcinoma in situ of skin of right lower limb, including hip: Secondary | ICD-10-CM | POA: Diagnosis not present

## 2023-09-28 DIAGNOSIS — D225 Melanocytic nevi of trunk: Secondary | ICD-10-CM | POA: Diagnosis not present

## 2023-09-28 DIAGNOSIS — D2272 Melanocytic nevi of left lower limb, including hip: Secondary | ICD-10-CM | POA: Diagnosis not present

## 2023-09-28 DIAGNOSIS — D0462 Carcinoma in situ of skin of left upper limb, including shoulder: Secondary | ICD-10-CM | POA: Diagnosis not present

## 2023-09-28 DIAGNOSIS — L821 Other seborrheic keratosis: Secondary | ICD-10-CM | POA: Diagnosis not present

## 2023-09-28 DIAGNOSIS — D2262 Melanocytic nevi of left upper limb, including shoulder: Secondary | ICD-10-CM | POA: Diagnosis not present

## 2023-09-28 DIAGNOSIS — D485 Neoplasm of uncertain behavior of skin: Secondary | ICD-10-CM | POA: Diagnosis not present

## 2023-10-01 DIAGNOSIS — D0471 Carcinoma in situ of skin of right lower limb, including hip: Secondary | ICD-10-CM | POA: Diagnosis not present

## 2023-10-01 DIAGNOSIS — D0462 Carcinoma in situ of skin of left upper limb, including shoulder: Secondary | ICD-10-CM | POA: Diagnosis not present

## 2023-10-12 DIAGNOSIS — Z96652 Presence of left artificial knee joint: Secondary | ICD-10-CM | POA: Diagnosis not present

## 2023-11-09 DIAGNOSIS — E039 Hypothyroidism, unspecified: Secondary | ICD-10-CM | POA: Diagnosis not present

## 2023-12-13 DIAGNOSIS — E039 Hypothyroidism, unspecified: Secondary | ICD-10-CM | POA: Diagnosis not present

## 2023-12-13 DIAGNOSIS — Z6841 Body Mass Index (BMI) 40.0 and over, adult: Secondary | ICD-10-CM | POA: Diagnosis not present

## 2023-12-13 DIAGNOSIS — I10 Essential (primary) hypertension: Secondary | ICD-10-CM | POA: Diagnosis not present

## 2023-12-13 DIAGNOSIS — G4730 Sleep apnea, unspecified: Secondary | ICD-10-CM | POA: Diagnosis not present

## 2023-12-13 DIAGNOSIS — K580 Irritable bowel syndrome with diarrhea: Secondary | ICD-10-CM | POA: Diagnosis not present

## 2023-12-13 DIAGNOSIS — Z1331 Encounter for screening for depression: Secondary | ICD-10-CM | POA: Diagnosis not present

## 2023-12-13 DIAGNOSIS — Z0000 Encounter for general adult medical examination without abnormal findings: Secondary | ICD-10-CM | POA: Diagnosis not present

## 2023-12-13 DIAGNOSIS — E119 Type 2 diabetes mellitus without complications: Secondary | ICD-10-CM | POA: Diagnosis not present

## 2023-12-13 DIAGNOSIS — E785 Hyperlipidemia, unspecified: Secondary | ICD-10-CM | POA: Diagnosis not present

## 2023-12-13 DIAGNOSIS — G250 Essential tremor: Secondary | ICD-10-CM | POA: Diagnosis not present

## 2024-01-11 ENCOUNTER — Other Ambulatory Visit: Payer: Self-pay | Admitting: Family Medicine

## 2024-01-11 DIAGNOSIS — Z1231 Encounter for screening mammogram for malignant neoplasm of breast: Secondary | ICD-10-CM

## 2024-01-27 ENCOUNTER — Ambulatory Visit
Admission: RE | Admit: 2024-01-27 | Discharge: 2024-01-27 | Disposition: A | Discharge status: Home / Self Care | Source: Ambulatory Visit | Attending: Family Medicine | Admitting: Family Medicine

## 2024-01-27 DIAGNOSIS — Z1231 Encounter for screening mammogram for malignant neoplasm of breast: Secondary | ICD-10-CM | POA: Insufficient documentation

## 2024-01-31 DIAGNOSIS — E119 Type 2 diabetes mellitus without complications: Secondary | ICD-10-CM | POA: Diagnosis not present

## 2024-01-31 DIAGNOSIS — I10 Essential (primary) hypertension: Secondary | ICD-10-CM | POA: Diagnosis not present

## 2024-01-31 DIAGNOSIS — E039 Hypothyroidism, unspecified: Secondary | ICD-10-CM | POA: Diagnosis not present

## 2024-02-10 DIAGNOSIS — R350 Frequency of micturition: Secondary | ICD-10-CM | POA: Diagnosis not present

## 2024-02-10 DIAGNOSIS — I10 Essential (primary) hypertension: Secondary | ICD-10-CM | POA: Diagnosis not present

## 2024-02-10 DIAGNOSIS — N390 Urinary tract infection, site not specified: Secondary | ICD-10-CM | POA: Diagnosis not present

## 2024-02-14 ENCOUNTER — Ambulatory Visit: Admitting: Obstetrics and Gynecology

## 2024-02-22 ENCOUNTER — Encounter: Payer: Self-pay | Admitting: *Deleted

## 2024-02-22 NOTE — Progress Notes (Signed)
 Cindy Wolfe                                          MRN: [id number]   02/22/2024   The [facility] Quality Team Specialist reviewed this patient medical record for the purposes of chart review for care gap closure. The following were reviewed: chart review for care gap closure-controlling blood pressure.     [facility] Quality Team

## 2024-02-22 NOTE — Progress Notes (Signed)
 Cindy Wolfe                                          MRN: [id number]   02/22/2024   The [facility] Quality Team Specialist reviewed this patient medical record for the purposes of chart review for care gap closure. The following were reviewed: abstraction for care gap closure-glycemic status assessment and kidney health evaluation for diabetes:eGFR  and uACR.    [facility] Quality Team
